# Patient Record
Sex: Female | Born: 1975 | Race: Black or African American | Hispanic: No | Marital: Single | Smoking: Former smoker
Health system: Southern US, Community
[De-identification: ages and names within clinical notes are randomized; demographics above are authoritative.]

## PROBLEM LIST (undated history)

## (undated) DIAGNOSIS — I10 Essential (primary) hypertension: Secondary | ICD-10-CM

## (undated) DIAGNOSIS — T7840XA Allergy, unspecified, initial encounter: Secondary | ICD-10-CM

## (undated) DIAGNOSIS — M1990 Unspecified osteoarthritis, unspecified site: Secondary | ICD-10-CM

## (undated) DIAGNOSIS — F329 Major depressive disorder, single episode, unspecified: Secondary | ICD-10-CM

## (undated) DIAGNOSIS — E079 Disorder of thyroid, unspecified: Secondary | ICD-10-CM

## (undated) DIAGNOSIS — F32A Depression, unspecified: Secondary | ICD-10-CM

## (undated) HISTORY — DX: Disorder of thyroid, unspecified: E07.9

## (undated) HISTORY — PX: TONSILLECTOMY: SUR1361

## (undated) HISTORY — PX: APPENDECTOMY: SHX54

## (undated) HISTORY — DX: Essential (primary) hypertension: I10

## (undated) HISTORY — DX: Allergy, unspecified, initial encounter: T78.40XA

## (undated) HISTORY — DX: Major depressive disorder, single episode, unspecified: F32.9

## (undated) HISTORY — DX: Unspecified osteoarthritis, unspecified site: M19.90

## (undated) HISTORY — PX: ABDOMINAL HYSTERECTOMY: SHX81

## (undated) HISTORY — DX: Depression, unspecified: F32.A

---

## 2016-01-26 LAB — BASIC METABOLIC PANEL
BUN: 12 (ref 4–21)
CREATININE: 0.8 (ref 0.5–1.1)
Glucose: 90
POTASSIUM: 3.8 (ref 3.4–5.3)
Sodium: 141 (ref 137–147)

## 2016-07-18 LAB — CBC AND DIFFERENTIAL
HEMATOCRIT: 39 (ref 36–46)
HEMOGLOBIN: 13.2 (ref 12.0–16.0)
PLATELETS: 269 (ref 150–399)
WBC: 12.3

## 2016-07-18 LAB — BASIC METABOLIC PANEL
BUN: 10 (ref 4–21)
Creatinine: 0.7 (ref 0.5–1.1)
Glucose: 112
Potassium: 3.3 — AB (ref 3.4–5.3)
SODIUM: 140 (ref 137–147)

## 2016-07-18 LAB — HEPATIC FUNCTION PANEL
ALT: 16 (ref 7–35)
AST: 21 (ref 13–35)

## 2016-07-18 LAB — LIPID PANEL
Cholesterol: 193 (ref 0–200)
HDL: 51 (ref 35–70)
LDL CALC: 122
TRIGLYCERIDES: 101 (ref 40–160)

## 2016-08-30 LAB — TSH: TSH: 5.82 (ref <=5.90)

## 2016-11-02 LAB — HEMOGLOBIN A1C: Hemoglobin A1C: 6.2

## 2017-12-02 ENCOUNTER — Encounter: Payer: Self-pay | Admitting: Family Medicine

## 2017-12-02 ENCOUNTER — Ambulatory Visit: Payer: BLUE CROSS/BLUE SHIELD | Admitting: Family Medicine

## 2017-12-02 VITALS — BP 128/80 | Ht 65.0 in | Wt 234.2 lb

## 2017-12-02 DIAGNOSIS — M5412 Radiculopathy, cervical region: Secondary | ICD-10-CM

## 2017-12-02 DIAGNOSIS — Z23 Encounter for immunization: Secondary | ICD-10-CM | POA: Diagnosis not present

## 2017-12-02 DIAGNOSIS — I10 Essential (primary) hypertension: Secondary | ICD-10-CM

## 2017-12-02 DIAGNOSIS — Z0000 Encounter for general adult medical examination without abnormal findings: Secondary | ICD-10-CM

## 2017-12-02 DIAGNOSIS — E039 Hypothyroidism, unspecified: Secondary | ICD-10-CM | POA: Diagnosis not present

## 2017-12-02 DIAGNOSIS — R7989 Other specified abnormal findings of blood chemistry: Secondary | ICD-10-CM | POA: Diagnosis not present

## 2017-12-02 DIAGNOSIS — M7712 Lateral epicondylitis, left elbow: Secondary | ICD-10-CM | POA: Insufficient documentation

## 2017-12-02 DIAGNOSIS — R739 Hyperglycemia, unspecified: Secondary | ICD-10-CM

## 2017-12-02 NOTE — Progress Notes (Signed)
im 

## 2017-12-02 NOTE — Progress Notes (Addendum)
Subjective:  Patient ID: Brenda Kim, female    DOB: 1976/07/14  Age: 42 y.o. MRN: [id number]  CC: Establish Care   HPI Phillis Thackeray presents for establishment of care and follow-up of her hypertension, elevated blood sugar, and hypothyroidism.  She also reports ongoing history of neck pain with a history of arthritis in her neck this is been accompanied by a stocking glove paresthesias in her left arm.  She also has pain in the elbow area.  She she has a history of repetitive motion in her job some years ago when she works at [employer].  She is currently working in mental health counseling and substance abuse.  She smokes 1 cigarette a day and is working on quitting.  She drinks occasionally.  She uses no illicit drugs.  She is active with walking, exercising and doing some yoga.  She also has a history of hypothyroidism and says that her levothyroxine was recently increased to 88 mcg 2 months ago.  She is nonfasting today and will return fasting for her blood work.  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  She is status post appendectomy.  She lives with her significant other and he also smokes.  She is nonfasting today and will return fasting for blood work.  Outpatient Medications Prior to Visit  Medication Sig Dispense Refill  . hydrochlorothiazide (HYDRODIURIL) 25 MG tablet Take 1 tablet by mouth daily.  0  . losartan (COZAAR) 100 MG tablet Take 1 tablet by mouth daily.  1  . metFORMIN (GLUCOPHAGE-XR) 500 MG 24 hr tablet Take 1 tablet by mouth daily.  0  . amLODipine (NORVASC) 10 MG tablet Take 1 tablet by mouth daily.    Marland Kitchen levothyroxine (SYNTHROID, LEVOTHROID) 88 MCG tablet Take 88 mcg by mouth daily before breakfast.     No facility-administered medications prior to visit.     ROS Review of Systems  Constitutional: Negative for chills, fatigue, fever and unexpected weight change.  HENT: Negative.   Eyes: Negative for photophobia and visual disturbance.   Respiratory: Negative for cough, chest tightness and shortness of breath.   Cardiovascular: Negative for chest pain and palpitations.  Gastrointestinal: Negative.   Endocrine: Negative for polyphagia and polyuria.  Genitourinary: Negative for difficulty urinating, frequency and hematuria.  Musculoskeletal: Positive for neck pain. Negative for neck stiffness.  Skin: Negative for pallor and rash.  Allergic/Immunologic: Negative for immunocompromised state.  Neurological: Positive for numbness. Negative for weakness and headaches.  Hematological: Does not bruise/bleed easily.  Psychiatric/Behavioral: Negative for behavioral problems, confusion and dysphoric mood.    Objective:  BP 128/80 (BP Location: Left Arm, Patient Position: Sitting, Cuff Size: Large)   Ht 5\' 5"  (1.651 m)   Wt 234 lb 4 oz (106.3 kg)   BMI 38.98 kg/m   BP Readings from Last 3 Encounters:  12/02/17 128/80    Wt Readings from Last 3 Encounters:  12/02/17 234 lb 4 oz (106.3 kg)    Physical Exam  Constitutional: She is oriented to person, place, and time. She appears well-developed and well-nourished. No distress.  HENT:  Head: Normocephalic and atraumatic.  Right Ear: External ear normal.  Left Ear: External ear normal.  Mouth/Throat: Oropharynx is clear and moist. No oropharyngeal exudate.  Eyes: Conjunctivae are normal. Pupils are equal, round, and reactive to light. Right eye exhibits no discharge. Left eye exhibits no discharge. No scleral icterus.  Neck: Neck supple. No JVD present. No tracheal deviation present. No thyromegaly  present.  Cardiovascular: Normal rate, regular rhythm and normal heart sounds.  Pulmonary/Chest: Effort normal and breath sounds normal. No stridor.  Abdominal: Bowel sounds are normal.  Musculoskeletal:       Left elbow: She exhibits normal range of motion. Tenderness found. Lateral epicondyle tenderness noted.       Cervical back: She exhibits bony tenderness. She exhibits  normal range of motion, no tenderness, no swelling and no spasm.       Back:       Arms: Lymphadenopathy:    She has no cervical adenopathy.  Neurological: She is alert and oriented to person, place, and time. She displays no atrophy and no tremor. She exhibits normal muscle tone.  Reflex Scores:      Tricep reflexes are 1+ on the right side and 1+ on the left side.      Bicep reflexes are 1+ on the right side and 1+ on the left side.      Brachioradialis reflexes are 1+ on the right side and 1+ on the left side. Positive spurling to left.   Skin: Skin is warm and dry. She is not diaphoretic. No erythema.  Psychiatric: She has a normal mood and affect. Her behavior is normal.    Lab Results  Component Value Date   WBC 4.2 12/06/2017   HGB 12.5 12/06/2017   HCT 38.3 12/06/2017   PLT 261.0 12/06/2017   GLUCOSE 116 (H) 12/06/2017   CHOL 183 12/06/2017   TRIG 90.0 12/06/2017   HDL 49.20 12/06/2017   LDLCALC 116 (H) 12/06/2017   ALT 11 12/06/2017   AST 15 12/06/2017   NA 142 12/06/2017   K 4.1 12/06/2017   CL 102 12/06/2017   CREATININE 0.90 12/06/2017   BUN 15 12/06/2017   CO2 34 (H) 12/06/2017   TSH 10.89 (H) 12/06/2017   HGBA1C 6.2 12/06/2017    Patient was never admitted.  Assessment & Plan:   Melina was seen today for establish care.  Diagnoses and all orders for this visit:  Need for influenza vaccination -     Flu Vaccine QUAD 36+ mos IM  Essential hypertension -     CBC; Future -     Comprehensive metabolic panel; Future -     Urinalysis, Routine w reflex microscopic; Future  Acquired hypothyroidism -     TSH; Future -     levothyroxine (SYNTHROID, LEVOTHROID) 100 MCG tablet; Take 1 tablet (100 mcg total) by mouth daily.  Elevated blood sugar -     Comprehensive metabolic panel; Future -     Hemoglobin A1c; Future -     Urinalysis, Routine w reflex microscopic; Future  Cervical radiculopathy -     Ambulatory referral to Sports Medicine  Lateral  epicondylitis of left elbow -     Ambulatory referral to [place] maintenance -     CBC; Future -     Comprehensive metabolic panel; Future -     Lipid panel; Future -     Urinalysis, Routine w reflex microscopic; Future -     HIV antibody; Future  Elevated TSH -     levothyroxine (SYNTHROID, LEVOTHROID) 100 MCG tablet; Take 1 tablet (100 mcg total) by mouth daily.   I have discontinued Luciann Lazarz's amLODipine and levothyroxine. I am also having her start on levothyroxine. Additionally, I am having her maintain her hydrochlorothiazide, losartan, and metFORMIN.  Meds ordered this encounter  Medications  . levothyroxine (SYNTHROID, LEVOTHROID) 100 MCG tablet  Sig: Take 1 tablet (100 mcg total) by mouth daily.    Dispense:  90 tablet    Refill:  0     Follow-up: Return in about 3 months (around 03/02/2018).  Libby Maw, MD

## 2017-12-05 ENCOUNTER — Other Ambulatory Visit: Payer: BLUE CROSS/BLUE SHIELD

## 2017-12-06 ENCOUNTER — Other Ambulatory Visit (INDEPENDENT_AMBULATORY_CARE_PROVIDER_SITE_OTHER): Payer: BLUE CROSS/BLUE SHIELD

## 2017-12-06 DIAGNOSIS — Z0000 Encounter for general adult medical examination without abnormal findings: Secondary | ICD-10-CM | POA: Diagnosis not present

## 2017-12-06 DIAGNOSIS — I10 Essential (primary) hypertension: Secondary | ICD-10-CM | POA: Diagnosis not present

## 2017-12-06 DIAGNOSIS — E039 Hypothyroidism, unspecified: Secondary | ICD-10-CM | POA: Diagnosis not present

## 2017-12-06 DIAGNOSIS — R739 Hyperglycemia, unspecified: Secondary | ICD-10-CM | POA: Diagnosis not present

## 2017-12-06 LAB — URINALYSIS, ROUTINE W REFLEX MICROSCOPIC
BILIRUBIN URINE: NEGATIVE
HGB URINE DIPSTICK: NEGATIVE
Leukocytes, UA: NEGATIVE
Nitrite: NEGATIVE
PH: 5.5 (ref 5.0–8.0)
Specific Gravity, Urine: 1.025 (ref 1.000–1.030)
UROBILINOGEN UA: 1 (ref 0.0–1.0)
Urine Glucose: NEGATIVE

## 2017-12-06 LAB — COMPREHENSIVE METABOLIC PANEL
ALK PHOS: 58 U/L (ref 39–117)
ALT: 11 U/L (ref 0–35)
AST: 15 U/L (ref 0–37)
Albumin: 4.2 g/dL (ref 3.5–5.2)
BILIRUBIN TOTAL: 0.5 mg/dL (ref 0.2–1.2)
BUN: 15 mg/dL (ref 6–23)
CO2: 34 mEq/L — ABNORMAL HIGH (ref 19–32)
CREATININE: 0.9 mg/dL (ref 0.40–1.20)
Calcium: 9.7 mg/dL (ref 8.4–10.5)
Chloride: 102 mEq/L (ref 96–112)
GFR: 88.02 mL/min (ref >60.00)
GLUCOSE: 116 mg/dL — AB (ref 70–99)
Potassium: 4.1 mEq/L (ref 3.5–5.1)
SODIUM: 142 meq/L (ref 135–145)
TOTAL PROTEIN: 6.9 g/dL (ref 6.0–8.3)

## 2017-12-06 LAB — LIPID PANEL
CHOLESTEROL: 183 mg/dL (ref 0–200)
HDL: 49.2 mg/dL (ref >39.00)
LDL CALC: 116 mg/dL — AB (ref 0–99)
NonHDL: 134.14
TRIGLYCERIDES: 90 mg/dL (ref 0.0–149.0)
Total CHOL/HDL Ratio: 4
VLDL: 18 mg/dL (ref 0.0–40.0)

## 2017-12-06 LAB — CBC
HCT: 38.3 % (ref 36.0–46.0)
HEMOGLOBIN: 12.5 g/dL (ref 12.0–15.0)
MCHC: 32.6 g/dL (ref 30.0–36.0)
MCV: 84.7 fl (ref 78.0–100.0)
Platelets: 261 10*3/uL (ref 150.0–400.0)
RBC: 4.52 Mil/uL (ref 3.87–5.11)
RDW: 15.5 % (ref 11.5–15.5)
WBC: 4.2 10*3/uL (ref 4.0–10.5)

## 2017-12-06 LAB — HEMOGLOBIN A1C: Hgb A1c MFr Bld: 6.2 % (ref 4.6–6.5)

## 2017-12-06 LAB — TSH: TSH: 10.89 u[IU]/mL — AB (ref 0.35–4.50)

## 2017-12-06 NOTE — Addendum Note (Signed)
Addended by: Jon Billings on: 12/06/2017 01:46 PM   Modules accepted: Orders

## 2017-12-07 LAB — HIV ANTIBODY (ROUTINE TESTING W REFLEX): HIV 1&2 Ab, 4th Generation: NONREACTIVE

## 2017-12-09 MED ORDER — LEVOTHYROXINE SODIUM 100 MCG PO TABS: 100.0000 ug | ORAL_TABLET | Freq: Every day | ORAL | 0 refills | Status: DC

## 2017-12-09 NOTE — Addendum Note (Signed)
Addended by: Jon Billings on: 12/09/2017 04:48 PM   Modules accepted: Orders

## 2017-12-10 ENCOUNTER — Other Ambulatory Visit: Payer: Self-pay

## 2017-12-10 DIAGNOSIS — R7989 Other specified abnormal findings of blood chemistry: Secondary | ICD-10-CM

## 2017-12-16 ENCOUNTER — Ambulatory Visit: Payer: BLUE CROSS/BLUE SHIELD | Admitting: Family Medicine

## 2017-12-16 ENCOUNTER — Encounter: Payer: Self-pay | Admitting: Family Medicine

## 2017-12-16 ENCOUNTER — Other Ambulatory Visit: Payer: Self-pay | Admitting: Family Medicine

## 2017-12-16 ENCOUNTER — Ambulatory Visit: Payer: BLUE CROSS/BLUE SHIELD | Admitting: Nurse Practitioner

## 2017-12-16 VITALS — BP 132/84 | HR 68 | Temp 98.6°F | Ht 65.0 in | Wt 228.0 lb

## 2017-12-16 DIAGNOSIS — G8929 Other chronic pain: Secondary | ICD-10-CM | POA: Diagnosis not present

## 2017-12-16 DIAGNOSIS — M5412 Radiculopathy, cervical region: Secondary | ICD-10-CM

## 2017-12-16 DIAGNOSIS — M5442 Lumbago with sciatica, left side: Secondary | ICD-10-CM | POA: Diagnosis not present

## 2017-12-16 NOTE — Assessment & Plan Note (Signed)
Seems to have a component of spasm at the neck and radiculopathy.  - counseled on HEP  - if no improvement can try prednisone or gabapentin - can f/u in 4-6 weeks if no improvement consider imaging and/or PT

## 2017-12-16 NOTE — Telephone Encounter (Signed)
Don't see mention in the chart about Lorazepam. No refill there.   Okay to refill the Baclofen.

## 2017-12-16 NOTE — Assessment & Plan Note (Signed)
Seems to be muscular in nature with radicular symptoms  - counseled on HEP  - If no improvement consider imaging and/or PT

## 2017-12-16 NOTE — Progress Notes (Signed)
Brenda Kim - 42 y.o. female MRN [id number]  Date of birth: [birth year]  SUBJECTIVE:  Including CC & ROS.  Chief Complaint  Patient presents with  . Neck Pain    Brenda Kim is a 42 y.o. female that is presenting with neck pain and lower back pain. Ongoing for two months.Pain is located in the lateral aspect of her neck. Admits to tingling and numbness. Pain radiates down the posterior aspect of her left arm. Denies injury or surgeries. She has been taking Motrin. Symptoms are mild to moderate in nature. Denies any inciting event. Has not started a new job or new exercises.   Complaining of left-sided lower back pain with some radicular type symptoms on the left lower aspect of her lower legs. Has not done anything to improve her symptoms. Feels like her symptoms are staying the same. Symptoms are worsened with sitting in certain positions. Has not found anything to improve her symptoms.   Review of Systems  Constitutional: Negative for fever.  HENT: Negative for rhinorrhea.   Respiratory: Negative for cough.   Cardiovascular: Negative for chest pain.  Gastrointestinal: Negative for abdominal pain.  Musculoskeletal: Positive for back pain.  Skin: Negative for color change.  Neurological: Negative for weakness.  Hematological: Negative for adenopathy.  Psychiatric/Behavioral: Negative for agitation.    HISTORY: Past Medical, Surgical, Social, and Family History Reviewed & Updated per EMR.   Pertinent Historical Findings include:  Past Medical History:  Diagnosis Date  . Allergy   . Arthritis   . Depression   . Hypertension   . Thyroid disease     Past Surgical History:  Procedure Laterality Date  . ABDOMINAL HYSTERECTOMY    . APPENDECTOMY    . TONSILLECTOMY      Allergies  Allergen Reactions  . Ofloxacin Itching    Hives, swelling, redness Hives, swelling, redness   . Flucloxacillin Hives  . Other Rash    Family History  Problem Relation Age of Onset  .  Arthritis Mother   . Hyperlipidemia Mother   . Arthritis Father   . Diabetes Father   . Hyperlipidemia Father   . Mental illness Brother   . Diabetes Son   . Hypertension Son      Social History   Socioeconomic History  . Marital status: Single    Spouse name: Not on file  . Number of children: Not on file  . Years of education: Not on file  . Highest education level: Not on file  Social Needs  . Financial resource strain: Not on file  . Food insecurity - worry: Not on file  . Food insecurity - inability: Not on file  . Transportation needs - medical: Not on file  . Transportation needs - non-medical: Not on file  Occupational History  . Not on file  Tobacco Use  . Smoking status: Current Every Day Smoker  . Smokeless tobacco: Never Used  Substance and Sexual Activity  . Alcohol use: Yes  . Drug use: Not on file  . Sexual activity: Not on file  Other Topics Concern  . Not on file  Social History Narrative  . Not on file     PHYSICAL EXAM:  VS: BP 132/84 (BP Location: Left Arm, Patient Position: Sitting, Cuff Size: Normal)   Pulse 68   Temp 98.6 F (37 C) (Oral)   Ht 5\' 5"  (1.651 m)   Wt 228 lb (103.4 kg)   SpO2 96%   BMI 37.94 kg/m  Physical Exam  Gen: NAD, alert, cooperative with exam, well-appearing ENT: normal lips, normal nasal mucosa,  Eye: normal EOM, normal conjunctiva and lids CV:  no edema, +2 pedal pulses   Resp: no accessory muscle use, non-labored,   Skin: no rashes, no areas of induration  Neuro: normal tone, normal sensation to touch Psych:  normal insight, alert and oriented MSK:  Neck:  Normal ROM  Normal strength to resistance with shrug  Normal shoulder ROM  Normal signs of atrophy in her hands  Normal finger adduction and abduction  Normal wrist flexion and extension strength to resistance.  Back:  Some TTP of the paraspinal of the left lower back  No TTP of midline lumbar spine and GT  Normal IR and ER of the hips  Normal knee  flexion and extension  Normal dorsal and plantar flexion  Negative SLR b/l  Normal gait  Neurovascularly intact      ASSESSMENT & PLAN:   Cervical radiculopathy Seems to have a component of spasm at the neck and radiculopathy.  - counseled on HEP  - if no improvement can try prednisone or gabapentin - can f/u in 4-6 weeks if no improvement consider imaging and/or PT    Chronic left-sided low back pain with left-sided sciatica Seems to be muscular in nature with radicular symptoms  - counseled on HEP  - If no improvement consider imaging and/or PT

## 2017-12-16 NOTE — Patient Instructions (Signed)
Please try the exercises  Please let me know if you want to try a medication  If your symptoms have not improved in 4-6 weeks then please follow up with me

## 2017-12-17 ENCOUNTER — Encounter: Payer: Self-pay | Admitting: Nurse Practitioner

## 2017-12-17 ENCOUNTER — Other Ambulatory Visit (HOSPITAL_COMMUNITY)
Admission: RE | Admit: 2017-12-17 | Discharge: 2017-12-17 | Disposition: A | Discharge status: Home / Self Care | Payer: BLUE CROSS/BLUE SHIELD | Source: Ambulatory Visit | Attending: Nurse Practitioner | Admitting: Nurse Practitioner

## 2017-12-17 ENCOUNTER — Ambulatory Visit: Payer: BLUE CROSS/BLUE SHIELD | Admitting: Nurse Practitioner

## 2017-12-17 VITALS — BP 120/82 | HR 65 | Temp 98.3°F | Ht 65.0 in | Wt 235.0 lb

## 2017-12-17 DIAGNOSIS — R300 Dysuria: Secondary | ICD-10-CM

## 2017-12-17 DIAGNOSIS — N898 Other specified noninflammatory disorders of vagina: Secondary | ICD-10-CM

## 2017-12-17 LAB — POCT URINALYSIS DIPSTICK
BILIRUBIN UA: NEGATIVE
Blood, UA: NEGATIVE
GLUCOSE UA: NEGATIVE
Ketones, UA: NEGATIVE
LEUKOCYTES UA: NEGATIVE
Nitrite, UA: NEGATIVE
Protein, UA: NEGATIVE
Spec Grav, UA: >=1.03 — AB (ref 1.010–1.025)
Urobilinogen, UA: 0.2 E.U./dL
pH, UA: 6 (ref 5.0–8.0)

## 2017-12-17 MED ORDER — METRONIDAZOLE 500 MG PO TABS: 500.0000 mg | ORAL_TABLET | Freq: Two times a day (BID) | ORAL | 0 refills | Status: DC

## 2017-12-17 NOTE — Patient Instructions (Addendum)
Urinalysis was negative for UTI. Due to symptoms urine sent for culture.  you will be called with wet prep results.

## 2017-12-17 NOTE — Progress Notes (Signed)
Subjective:  Patient ID: Brenda Kim, female    DOB: Jul 08, 1976  Age: 42 y.o. MRN: [id number]  CC: Back Pain (lower left back pain---2 days- took ibuprofen) and Vaginal Discharge (discharge,odor--hx of BV burning when urinate a little--1 mo)   Vaginal Discharge  The patient's primary symptoms include genital itching, a genital odor and vaginal discharge. The patient's pertinent negatives include no genital rash, pelvic pain or vaginal bleeding. This is a recurrent problem. The current episode started more than 1 month ago. The problem occurs constantly. The problem has been waxing and waning. The problem affects both sides. She is not pregnant. Associated symptoms include back pain and dysuria. Pertinent negatives include no abdominal pain, chills, constipation, diarrhea, discolored urine, fever, flank pain, frequency, headaches, hematuria, joint pain, joint swelling, nausea, painful intercourse, rash, sore throat, urgency or vomiting. The vaginal discharge was milky, thick, white and yellow. There has been no bleeding. The symptoms are aggravated by intercourse. She has tried nothing for the symptoms. She is sexually active. It is unknown whether or not her partner has an STD. She uses hysterectomy for contraception. Her past medical history is significant for an STD and vaginosis. There is no history of PID.  re[port hx of recurrent BV, last treated 08/2017 with metronidazole tabs. Metronidazole gel is ineffective per patient.  Outpatient Medications Prior to Visit  Medication Sig Dispense Refill  . baclofen (LIORESAL) 10 MG tablet TAKE 1 TABLET BY MOUTH TWICE DAILY AS NEEDED FOR MUSCLE SPASMS 30 tablet 0  . baclofen (LIORESAL) 10 MG tablet Take 10 mg by mouth 3 (three) times daily.    . Cholecalciferol (VITAMIN D PO) Take by mouth.    . hydrochlorothiazide (HYDRODIURIL) 25 MG tablet Take 1 tablet by mouth daily.  0  . levothyroxine (SYNTHROID, LEVOTHROID) 100 MCG tablet Take 1 tablet (100 mcg  total) by mouth daily. 90 tablet 0  . LORazepam (ATIVAN) 0.5 MG tablet Take 0.5 mg by mouth every 8 (eight) hours.    Marland Kitchen losartan (COZAAR) 100 MG tablet Take 1 tablet by mouth daily.  1  . metFORMIN (GLUCOPHAGE-XR) 500 MG 24 hr tablet Take 1 tablet by mouth daily.  0  . NON FORMULARY Cinnamon OTC     No facility-administered medications prior to visit.     ROS See HPI  Objective:  BP 120/82   Pulse 65   Temp 98.3 F (36.8 C)   Ht 5\' 5"  (1.651 m)   Wt 235 lb (106.6 kg)   SpO2 97%   BMI 39.11 kg/m   BP Readings from Last 3 Encounters:  12/17/17 120/82  12/16/17 132/84  12/02/17 128/80    Wt Readings from Last 3 Encounters:  12/17/17 235 lb (106.6 kg)  12/16/17 228 lb (103.4 kg)  12/02/17 234 lb 4 oz (106.3 kg)    Physical Exam  Genitourinary: There is no rash on the right labia. There is no rash on the left labia. Right adnexum displays no tenderness. Left adnexum displays no tenderness. No erythema or tenderness in the vagina. Vaginal discharge found.    Lab Results  Component Value Date   WBC 4.2 12/06/2017   HGB 12.5 12/06/2017   HCT 38.3 12/06/2017   PLT 261.0 12/06/2017   GLUCOSE 116 (H) 12/06/2017   CHOL 183 12/06/2017   TRIG 90.0 12/06/2017   HDL 49.20 12/06/2017   LDLCALC 116 (H) 12/06/2017   ALT 11 12/06/2017   AST 15 12/06/2017   NA 142 12/06/2017   K 4.1  12/06/2017   CL 102 12/06/2017   CREATININE 0.90 12/06/2017   BUN 15 12/06/2017   CO2 34 (H) 12/06/2017   TSH 10.89 (H) 12/06/2017   HGBA1C 6.2 12/06/2017    Assessment & Plan:   Spencer was seen today for back pain and vaginal discharge.  Diagnoses and all orders for this visit:  Dysuria -     POCT urinalysis dipstick -     Urine Culture  Vaginal discharge -     Cervicovaginal ancillary only -     metroNIDAZOLE (FLAGYL) 500 MG tablet; Take 1 tablet (500 mg total) by mouth 2 (two) times daily.   I am having Rikki Spearing start on metroNIDAZOLE. I am also having her maintain her  hydrochlorothiazide, losartan, metFORMIN, levothyroxine, baclofen, baclofen, NON FORMULARY, Cholecalciferol (VITAMIN D PO), and LORazepam.  Meds ordered this encounter  Medications  . metroNIDAZOLE (FLAGYL) 500 MG tablet    Sig: Take 1 tablet (500 mg total) by mouth 2 (two) times daily.    Dispense:  14 tablet    Refill:  0    Order Specific Question:   Supervising Provider    Answer:   Lucille Passy [[id number]]    Follow-up: Return if symptoms worsen or fail to improve.  Wilfred Lacy, NP

## 2017-12-18 ENCOUNTER — Other Ambulatory Visit: Payer: Self-pay | Admitting: Nurse Practitioner

## 2017-12-18 DIAGNOSIS — N898 Other specified noninflammatory disorders of vagina: Secondary | ICD-10-CM

## 2017-12-18 LAB — URINE CULTURE
MICRO NUMBER:: 90122736
SPECIMEN QUALITY: ADEQUATE

## 2017-12-18 LAB — CERVICOVAGINAL ANCILLARY ONLY
BACTERIAL VAGINITIS: POSITIVE — AB
CHLAMYDIA, DNA PROBE: NEGATIVE
Candida vaginitis: NEGATIVE
NEISSERIA GONORRHEA: NEGATIVE
Trichomonas: NEGATIVE

## 2018-01-07 ENCOUNTER — Encounter: Payer: Self-pay | Admitting: Family Medicine

## 2018-01-07 ENCOUNTER — Other Ambulatory Visit: Payer: Self-pay | Admitting: Family Medicine

## 2018-01-19 ENCOUNTER — Other Ambulatory Visit: Payer: Self-pay | Admitting: Family Medicine

## 2018-01-22 ENCOUNTER — Ambulatory Visit: Payer: BLUE CROSS/BLUE SHIELD | Admitting: Family Medicine

## 2018-01-22 ENCOUNTER — Encounter: Payer: Self-pay | Admitting: Family Medicine

## 2018-01-22 ENCOUNTER — Ambulatory Visit (INDEPENDENT_AMBULATORY_CARE_PROVIDER_SITE_OTHER): Payer: BLUE CROSS/BLUE SHIELD

## 2018-01-22 VITALS — BP 110/90 | HR 64 | Wt 230.8 lb

## 2018-01-22 DIAGNOSIS — S8002XA Contusion of left knee, initial encounter: Secondary | ICD-10-CM | POA: Insufficient documentation

## 2018-01-22 DIAGNOSIS — F419 Anxiety disorder, unspecified: Secondary | ICD-10-CM | POA: Diagnosis not present

## 2018-01-22 DIAGNOSIS — F329 Major depressive disorder, single episode, unspecified: Secondary | ICD-10-CM | POA: Diagnosis not present

## 2018-01-22 DIAGNOSIS — E119 Type 2 diabetes mellitus without complications: Secondary | ICD-10-CM

## 2018-01-22 DIAGNOSIS — F32A Depression, unspecified: Secondary | ICD-10-CM | POA: Insufficient documentation

## 2018-01-22 MED ORDER — LORAZEPAM 0.5 MG PO TABS: 0.5000 mg | ORAL_TABLET | Freq: Every day | ORAL | 0 refills | Status: DC | PRN

## 2018-01-22 MED ORDER — METFORMIN HCL ER 500 MG PO TB24: 500.0000 mg | ORAL_TABLET | Freq: Every day | ORAL | 1 refills | Status: DC

## 2018-01-22 MED ORDER — PAROXETINE HCL ER 12.5 MG PO TB24: ORAL_TABLET | ORAL | 1 refills | Status: AC

## 2018-01-22 NOTE — Progress Notes (Signed)
Subjective:  Patient ID: Brenda Kim, female    DOB: December 26, 1975  Age: 42 y.o. MRN: [id number]  CC: Acute Visit   HPI Brenda Kim presents for evaluation of knee injury she sustained 2 weeks ago.  She was skating and tripped over a young person and landed directly on her left knee.  She has ongoing tenderness over her kneecap and cannot crawl on her knees.  This is not penetrate may or one that locks up or gives way.  She also tells a long-standing history of anxiety and depression.  She requests refill of lorazepam says that she uses it often and it helps to relieve her symptoms.  She is currently not taking an antidepressant.  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  She is the mother or children to her grown and out of the house but at home with her.  Outpatient Medications Prior to Visit  Medication Sig Dispense Refill  . baclofen (LIORESAL) 10 MG tablet TAKE 1 TABLET BY MOUTH TWICE DAILY AS NEEDED FOR MUSCLE SPASMS 30 tablet 0  . Cholecalciferol (VITAMIN D PO) Take by mouth.    . hydrochlorothiazide (HYDRODIURIL) 25 MG tablet Take 1 tablet by mouth daily.  0  . levothyroxine (SYNTHROID, LEVOTHROID) 100 MCG tablet Take 1 tablet (100 mcg total) by mouth daily. 90 tablet 0  . losartan (COZAAR) 100 MG tablet TAKE 1 TABLET BY MOUTH ONCE DAILY 90 tablet 1  . metroNIDAZOLE (FLAGYL) 500 MG tablet Take 1 tablet (500 mg total) by mouth 2 (two) times daily. 14 tablet 0  . NON FORMULARY Cinnamon OTC    . baclofen (LIORESAL) 10 MG tablet Take 10 mg by mouth 3 (three) times daily.    Marland Kitchen LORazepam (ATIVAN) 0.5 MG tablet Take 0.5 mg by mouth every 8 (eight) hours.    . metFORMIN (GLUCOPHAGE-XR) 500 MG 24 hr tablet Take 1 tablet by mouth daily.  0   No facility-administered medications prior to visit.     ROS Review of Systems  Constitutional: Negative.   HENT: Negative.   Eyes: Negative.   Respiratory: Negative.   Cardiovascular: Negative.   Gastrointestinal: Negative.   Genitourinary:  Negative.   Musculoskeletal: Positive for arthralgias. Negative for gait problem.  Skin: Negative.   Psychiatric/Behavioral: Positive for dysphoric mood. Negative for self-injury and suicidal ideas. The patient is nervous/anxious.     Objective:  BP 110/90 (BP Location: Right Arm, Patient Position: Sitting, Cuff Size: Large)   Pulse 64   Wt 230 lb 12.8 oz (104.7 kg)   BMI 38.41 kg/m   BP Readings from Last 3 Encounters:  01/22/18 110/90  12/17/17 120/82  12/16/17 132/84    Wt Readings from Last 3 Encounters:  01/22/18 230 lb 12.8 oz (104.7 kg)  12/17/17 235 lb (106.6 kg)  12/16/17 228 lb (103.4 kg)    Physical Exam  Constitutional: She is oriented to person, place, and time. She appears well-developed and well-nourished. No distress.  HENT:  Head: Normocephalic and atraumatic.  Right Ear: External ear normal.  Left Ear: External ear normal.  Eyes: Right eye exhibits no discharge. Left eye exhibits no discharge. No scleral icterus.  Pulmonary/Chest: Effort normal.  Musculoskeletal:       Left knee: She exhibits normal range of motion and no effusion. Tenderness found. Medial joint line and patellar tendon tenderness noted.  Neurological: She is alert and oriented to person, place, and time.  Skin: Skin is warm and dry. She is not diaphoretic.  Psychiatric: She  has a normal mood and affect. Her behavior is normal.   Depression screen [facility] 2/9 01/22/2018  Decreased Interest 2  Down, Depressed, Hopeless 2  PHQ - 2 Score 4  Altered sleeping 2  Tired, decreased energy 2  Change in appetite 2  Feeling bad or failure about yourself  2  Trouble concentrating 2  Moving slowly or fidgety/restless 0  Suicidal thoughts 0  PHQ-9 Score 14    Lab Results  Component Value Date   WBC 4.2 12/06/2017   HGB 12.5 12/06/2017   HCT 38.3 12/06/2017   PLT 261.0 12/06/2017   GLUCOSE 116 (H) 12/06/2017   CHOL 183 12/06/2017   TRIG 90.0 12/06/2017   HDL 49.20 12/06/2017   LDLCALC 116  (H) 12/06/2017   ALT 11 12/06/2017   AST 15 12/06/2017   NA 142 12/06/2017   K 4.1 12/06/2017   CL 102 12/06/2017   CREATININE 0.90 12/06/2017   BUN 15 12/06/2017   CO2 34 (H) 12/06/2017   TSH 10.89 (H) 12/06/2017   HGBA1C 6.2 12/06/2017    No results found.  Assessment & Plan:   Brenda Kim was seen today for acute visit.  Diagnoses and all orders for this visit:  Contusion of left knee, initial encounter -     DG Knee Complete 4 Views Left; Future -     DG Knee Complete 4 Views Left  Anxiety and depression -     Ambulatory referral to Psychology -     PARoxetine (PAXIL-CR) 12.5 MG 24 hr tablet; Take one each morning for one week and then increase to two each day. -     LORazepam (ATIVAN) 0.5 MG tablet; Take 1 tablet (0.5 mg total) by mouth daily as needed for anxiety.  Controlled type 2 diabetes mellitus without complication, without long-term current use of insulin (HCC) -     metFORMIN (GLUCOPHAGE-XR) 500 MG 24 hr tablet; Take 1 tablet (500 mg total) by mouth at bedtime.   I have changed Brenda Kim's LORazepam and metFORMIN. I am also having her start on PARoxetine. Additionally, I am having her maintain her hydrochlorothiazide, levothyroxine, baclofen, NON FORMULARY, Cholecalciferol (VITAMIN D PO), metroNIDAZOLE, and losartan.  Meds ordered this encounter  Medications  . PARoxetine (PAXIL-CR) 12.5 MG 24 hr tablet    Sig: Take one each morning for one week and then increase to two each day.    Dispense:  60 tablet    Refill:  1  . LORazepam (ATIVAN) 0.5 MG tablet    Sig: Take 1 tablet (0.5 mg total) by mouth daily as needed for anxiety.    Dispense:  30 tablet    Refill:  0  . metFORMIN (GLUCOPHAGE-XR) 500 MG 24 hr tablet    Sig: Take 1 tablet (500 mg total) by mouth at bedtime.    Dispense:  100 tablet    Refill:  1     Follow-up: Return in about 1 month (around 02/22/2018).  Libby Maw, MD

## 2018-01-22 NOTE — Patient Instructions (Addendum)
Living With Depression Everyone experiences occasional disappointment, sadness, and loss in their lives. When you are feeling down, blue, or sad for at least 2 weeks in a row, it may mean that you have depression. Depression can affect your thoughts and feelings, relationships, daily activities, and physical health. It is caused by changes in the way your brain functions. If you receive a diagnosis of depression, your health care provider will tell you which type of depression you have and what treatment options are available to you. If you are living with depression, there are ways to help you recover from it and also ways to prevent it from coming back. How to cope with lifestyle changes Coping with stress Stress is your body's reaction to life changes and events, both good and bad. Stressful situations may include:  Getting married.  The death of a spouse.  Losing a job.  Retiring.  Having a baby.  Stress can last just a few hours or it can be ongoing. Stress can play a major role in depression, so it is important to learn both how to cope with stress and how to think about it differently. Talk with your health care provider or a counselor if you would like to learn more about stress reduction. He or she may suggest some stress reduction techniques, such as:  Music therapy. This can include creating music or listening to music. Choose music that you enjoy and that inspires you.  Mindfulness-based meditation. This kind of meditation can be done while sitting or walking. It involves being aware of your normal breaths, rather than trying to control your breathing.  Centering prayer. This is a kind of meditation that involves focusing on a spiritual word or phrase. Choose a word, phrase, or sacred image that is meaningful to you and that brings you peace.  Deep breathing. To do this, expand your stomach and inhale slowly through your nose. Hold your breath for 3-5 seconds, then exhale  slowly, allowing your stomach muscles to relax.  Muscle relaxation. This involves intentionally tensing muscles then relaxing them.  Choose a stress reduction technique that fits your lifestyle and personality. Stress reduction techniques take time and practice to develop. Set aside 5-15 minutes a day to do them. Therapists can offer training in these techniques. The training may be covered by some insurance plans. Other things you can do to manage stress include:  Keeping a stress diary. This can help you learn what triggers your stress and ways to control your response.  Understanding what your limits are and saying no to requests or events that lead to a schedule that is too full.  Thinking about how you respond to certain situations. You may not be able to control everything, but you can control how you react.  Adding humor to your life by watching funny films or TV shows.  Making time for activities that help you relax and not feeling guilty about spending your time this way.  Medicines Your health care provider may suggest certain medicines if he or she feels that they will help improve your condition. Avoid using alcohol and other substances that may prevent your medicines from working properly (may interact). It is also important to:  Talk with your pharmacist or health care provider about all the medicines that you take, their possible side effects, and what medicines are safe to take together.  Make it your goal to take part in all treatment decisions (shared decision-making). This includes giving input on the side   effects of medicines. It is best if shared decision-making with your health care provider is part of your total treatment plan.  If your health care provider prescribes a medicine, you may not notice the full benefits of it for 4-8 weeks. Most people who are treated for depression need to be on medicine for at least 6-12 months after they feel better. If you are taking  medicines as part of your treatment, do not stop taking medicines without first talking to your health care provider. You may need to have the medicine slowly decreased (tapered) over time to decrease the risk of harmful side effects. Relationships Your health care provider may suggest family therapy along with individual therapy and drug therapy. While there may not be family problems that are causing you to feel depressed, it is still important to make sure your family learns as much as they can about your mental health. Having your family's support can help make your treatment successful. How to recognize changes in your condition Everyone has a different response to treatment for depression. Recovery from major depression happens when you have not had signs of major depression for two months. This may mean that you will start to:  Have more interest in doing activities.  Feel less hopeless than you did 2 months ago.  Have more energy.  Overeat less often, or have better or improving appetite.  Have better concentration.  Your health care provider will work with you to decide the next steps in your recovery. It is also important to recognize when your condition is getting worse. Watch for these signs:  Having fatigue or low energy.  Eating too much or too little.  Sleeping too much or too little.  Feeling restless, agitated, or hopeless.  Having trouble concentrating or making decisions.  Having unexplained physical complaints.  Feeling irritable, angry, or aggressive.  Get help as soon as you or your family members notice these symptoms coming back. How to get support and help from others How to talk with friends and family members about your condition Talking to friends and family members about your condition can provide you with one way to get support and guidance. Reach out to trusted friends or family members, explain your symptoms to them, and let them know that you are  working with a health care provider to treat your depression. Financial resources Not all insurance plans cover mental health care, so it is important to check with your insurance carrier. If paying for co-pays or counseling services is a problem, search for a local or county mental health care center. They may be able to offer public mental health care services at low or no cost when you are not able to see a private health care provider. If you are taking medicine for depression, you may be able to get the generic form, which may be less expensive. Some makers of prescription medicines also offer help to patients who cannot afford the medicines they need. Follow these instructions at home:  Get the right amount and quality of sleep.  Cut down on using caffeine, tobacco, alcohol, and other potentially harmful substances.  Try to exercise, such as walking or lifting small weights.  Take over-the-counter and prescription medicines only as told by your health care provider.  Eat a healthy diet that includes plenty of vegetables, fruits, whole grains, low-fat dairy products, and lean protein. Do not eat a lot of foods that are high in solid fats, added sugars, or salt.    Keep all follow-up visits as told by your health care provider. This is important. Contact a health care provider if:  You stop taking your antidepressant medicines, and you have any of these symptoms: ? Nausea. ? Headache. ? Feeling lightheaded. ? Chills and body aches. ? Not being able to sleep (insomnia).  You or your friends and family think your depression is getting worse. Get help right away if:  You have thoughts of hurting yourself or others. If you ever feel like you may hurt yourself or others, or have thoughts about taking your own life, get help right away. You can go to your nearest emergency department or call:  Your local emergency services (911 in the [place]  A suicide crisis helpline, such as the  [place] at 3321879589. This is open 24-hours a day.  Summary  If you are living with depression, there are ways to help you recover from it and also ways to prevent it from coming back.  Work with your health care team to create a management plan that includes counseling, stress management techniques, and healthy lifestyle habits. This information is not intended to replace advice given to you by your health care provider. Make sure you discuss any questions you have with your health care provider. Document Released: 10/08/2016 Document Revised: 10/08/2016 Document Reviewed: 10/08/2016 Elsevier Interactive Patient Education  2018 [employer].  Type 2 Diabetes Mellitus, Diagnosis, Adult Type 2 diabetes (type 2 diabetes mellitus) is a long-term (chronic) disease. In type 2 diabetes, one or both of these problems may be present:  The pancreas does not make enough of a hormone called insulin.  Cells in the body do not respond properly to insulin that the body makes (insulin resistance).  Normally, insulin allows blood sugar (glucose) to enter cells in the body. The cells use glucose for energy. Insulin resistance or lack of insulin causes excess glucose to build up in the blood instead of going into cells. As a result, high blood glucose (hyperglycemia) develops. What increases the risk? The following factors may make you more likely to develop type 2 diabetes:  Having a family member with type 2 diabetes.  Being overweight or obese.  Having an inactive (sedentary) lifestyle.  Having been diagnosed with insulin resistance.  Having a history of prediabetes, gestational diabetes, or polycystic ovarian syndrome (PCOS).  Being of American-Indian, African-American, Hispanic/Latino, or Asian/Pacific Islander descent.  What are the signs or symptoms? In the early stage of this condition, you may not have symptoms. Symptoms develop slowly and may  include:  Increased thirst (polydipsia).  Increased hunger(polyphagia).  Increased urination (polyuria).  Increased urination during the night (nocturia).  Unexplained weight loss.  Frequent infections that keep coming back (recurring).  Fatigue.  Weakness.  Vision changes, such as blurry vision.  Cuts or bruises that are slow to heal.  Tingling or numbness in the hands or feet.  Dark patches on the skin (acanthosis nigricans).  How is this diagnosed?  This condition is diagnosed based on your symptoms, your medical history, a physical exam, and your blood glucose level. Your blood glucose may be checked with one or more of the following blood tests:  A fasting blood glucose (FBG) test. You will not be allowed to eat (you will fast) for at least 8 hours before a blood sample is taken.  A random blood glucose test. This checks blood glucose at any time of day regardless of when you ate.  An A1c (hemoglobin A1c) blood  test. This provides information about blood glucose control over the previous 2-3 months.  An oral glucose tolerance test (OGTT). This measures your blood glucose at two times: ? After fasting. This is your baseline blood glucose level. ? Two hours after drinking a beverage that contains glucose.  You may be diagnosed with type 2 diabetes if:  Your FBG level is 126 mg/dL (7.0 mmol/L) or higher.  Your random blood glucose level is 200 mg/dL (11.1 mmol/L) or higher.  Your A1c level is 6.5% or higher.  Your OGGT result is higher than 200 mg/dL (11.1 mmol/L).  These blood tests may be repeated to confirm your diagnosis. How is this treated?  Your treatment may be managed by a specialist called an endocrinologist. Type 2 diabetes may be treated by following instructions from your health care provider about:  Making diet and lifestyle changes. This may include: ? Following an individualized nutrition plan that is developed by a diet and nutrition  specialist (registered dietitian). ? Exercising regularly. ? Finding ways to manage stress.  Checking your blood glucose level as often as directed.  Taking diabetes medicines or insulin daily. This helps to keep your blood glucose levels in the healthy range. ? If you use insulin, you may need to adjust the dosage depending on how physically active you are and what foods you eat. Your health care provider will tell you how to adjust your dosage.  Taking medicines to help prevent complications from diabetes, such as: ? Aspirin. ? Medicine to lower cholesterol. ? Medicine to control blood pressure.  Your health care provider will set individualized treatment goals for you. Your goals will be based on your age, other medical conditions you have, and how you respond to diabetes treatment. Generally, the goal of treatment is to maintain the following blood glucose levels:  Before meals (preprandial): 80-130 mg/dL (4.4-7.2 mmol/L).  After meals (postprandial): below 180 mg/dL (10 mmol/L).  A1c level: less than 7%.  Follow these instructions at home: Questions to [place] Provider Consider asking the following questions:  Do I need to meet with a diabetes educator?  Where can I find a support group for people with diabetes?  What equipment will I need to manage my diabetes at home?  What diabetes medicines do I need, and when should I take them?  How often do I need to check my blood glucose?  What number can I call if I have questions?  When is my next appointment?  General instructions  Take over-the-counter and prescription medicines only as told by your health care provider.  Keep all follow-up visits as told by your health care provider. This is important.  For more information about diabetes, visit: ? American Diabetes Association (ADA): www.diabetes.org ? American Association of Diabetes Educators (AADE): www.diabeteseducator.org/patient-resources Contact  a health care provider if:  Your blood glucose is at or above 240 mg/dL (13.3 mmol/L) for 2 days in a row.  You have been sick or have had a fever for 2 days or longer and you are not getting better.  You have any of the following problems for more than 6 hours: ? You cannot eat or drink. ? You have nausea and vomiting. ? You have diarrhea. Get help right away if:  Your blood glucose is lower than 54 mg/dL (3.0 mmol/L).  You become confused or you have trouble thinking clearly.  You have difficulty breathing.  You have moderate or large ketone levels in your urine. This information  is not intended to replace advice given to you by your health care provider. Make sure you discuss any questions you have with your health care provider. Document Released: 11/05/2005 Document Revised: 04/12/2016 Document Reviewed: 12/09/2015 Elsevier Interactive Patient Education  [employer].

## 2018-02-21 ENCOUNTER — Other Ambulatory Visit: Payer: BLUE CROSS/BLUE SHIELD

## 2018-02-24 ENCOUNTER — Other Ambulatory Visit: Payer: Self-pay | Admitting: Family Medicine

## 2018-02-24 ENCOUNTER — Other Ambulatory Visit (INDEPENDENT_AMBULATORY_CARE_PROVIDER_SITE_OTHER): Payer: BLUE CROSS/BLUE SHIELD

## 2018-02-24 ENCOUNTER — Telehealth: Payer: Self-pay | Admitting: Family Medicine

## 2018-02-24 DIAGNOSIS — R7989 Other specified abnormal findings of blood chemistry: Secondary | ICD-10-CM | POA: Diagnosis not present

## 2018-02-24 DIAGNOSIS — E039 Hypothyroidism, unspecified: Secondary | ICD-10-CM

## 2018-02-24 LAB — TSH: TSH: 1.18 u[IU]/mL (ref 0.35–4.50)

## 2018-02-24 NOTE — Telephone Encounter (Signed)
Do you want to hold off on refilling the Levothyroxine until her lab result comes back?

## 2018-02-24 NOTE — Telephone Encounter (Signed)
Patient came into office to do labs. Patient is requesting a prescription refill on baclofen and levothyroxine. Please notify patient about the status of refill.

## 2018-02-25 MED ORDER — LEVOTHYROXINE SODIUM 100 MCG PO TABS: 100.0000 ug | ORAL_TABLET | Freq: Every day | ORAL | 1 refills | Status: DC

## 2018-02-25 NOTE — Telephone Encounter (Signed)
Rx's sent, patient aware & patient is aware that her thyroid level is normal.

## 2018-02-26 ENCOUNTER — Ambulatory Visit: Payer: BLUE CROSS/BLUE SHIELD | Admitting: Psychology

## 2018-03-10 ENCOUNTER — Ambulatory Visit (INDEPENDENT_AMBULATORY_CARE_PROVIDER_SITE_OTHER): Payer: BLUE CROSS/BLUE SHIELD | Admitting: Psychology

## 2018-03-10 DIAGNOSIS — F4323 Adjustment disorder with mixed anxiety and depressed mood: Secondary | ICD-10-CM

## 2018-03-11 ENCOUNTER — Ambulatory Visit: Payer: BLUE CROSS/BLUE SHIELD | Admitting: Family Medicine

## 2018-03-11 ENCOUNTER — Encounter: Payer: Self-pay | Admitting: Family Medicine

## 2018-03-11 DIAGNOSIS — K589 Irritable bowel syndrome without diarrhea: Secondary | ICD-10-CM | POA: Insufficient documentation

## 2018-03-11 DIAGNOSIS — K581 Irritable bowel syndrome with constipation: Secondary | ICD-10-CM

## 2018-03-11 NOTE — Progress Notes (Signed)
   Subjective:    Patient ID: Brenda Kim, female    DOB: [birth year], 42 y.o.   MRN: [id number]  Chief Complaint  Patient presents with  . Abdominal Pain     HPI Brenda Kim is a 42 y.o. female here today with complaint of abdominal discomfort and bloating.  She has a history of IBS with constipation and is followed by GI for management of this.  She tells me today that she has had what she describes as as "knot" in the left abdominal area that comes and goes.  She had her GI specialist look at this however was told there was nothing there.  She continues to notice this and notes that when she pushes on this or lays on this she will belch or have gas and the area tends to improve.  She denies significant pain associated with this area.  She does continue to have some issues with constipation and was recently prescribed linzess which was helpful.  She continues miralax for maintenance.   She denies nausea, vomiting, anorexia, blood in stool, diarrhea, fever, or chills.  TSH was recently checked and was normal.    Review of Systems ROS per HPI, otherwise negative.     Objective:   Physical Exam  Constitutional: She is oriented to person, place, and time. She appears well-nourished. No distress.  HENT:  Mouth/Throat: Oropharynx is clear and moist.  Eyes: No scleral icterus.  Cardiovascular: Normal rate, regular rhythm and normal heart sounds.  Pulmonary/Chest: Effort normal and breath sounds normal.  Abdominal: Soft. She exhibits no distension and no mass. There is no tenderness.  Bowel sounds hypoactive but present  Neurological: She is alert and oriented to person, place, and time.  Psychiatric: She has a normal mood and affect. Her behavior is normal.          Assessment & Plan:  IBS (irritable bowel syndrome) IBS, constipation predominant.  I suspect the intermittent fullness that she is experiencing may be bowel distention from stool or gas.  Reassured her that no masses are  palpated on exam.  She will continue bowel regimen and following with GI specialist.  Discussed FODMAP diet and given handout that may help with gas and bloating as well.

## 2018-03-11 NOTE — Assessment & Plan Note (Signed)
IBS, constipation predominant.  I suspect the intermittent fullness that she is experiencing may be bowel distention from stool or gas.  Reassured her that no masses are palpated on exam.  She will continue bowel regimen and following with GI specialist.  Discussed FODMAP diet and given handout that may help with gas and bloating as well.

## 2018-03-11 NOTE — Patient Instructions (Signed)
Continue bowel regimen for constipation with linzess as needed Try FODMAP diet

## 2018-03-31 ENCOUNTER — Ambulatory Visit: Payer: Self-pay | Admitting: Psychology

## 2018-04-01 ENCOUNTER — Ambulatory Visit: Payer: Self-pay | Admitting: Nurse Practitioner

## 2018-04-01 ENCOUNTER — Encounter: Payer: Self-pay | Admitting: Nurse Practitioner

## 2018-04-01 VITALS — BP 110/74 | HR 68 | Temp 98.4°F | Ht 65.0 in | Wt 220.0 lb

## 2018-04-01 DIAGNOSIS — N761 Subacute and chronic vaginitis: Secondary | ICD-10-CM

## 2018-04-01 MED ORDER — METRONIDAZOLE 500 MG PO TABS: 500.0000 mg | ORAL_TABLET | Freq: Two times a day (BID) | ORAL | 0 refills | Status: DC

## 2018-04-01 MED ORDER — METRONIDAZOLE 0.75 % VA GEL: 1.0000 | VAGINAL | 3 refills | Status: DC

## 2018-04-01 NOTE — Patient Instructions (Signed)
You will need a vaginal exam if no improvement in next 2weeks.  Start metronidazole gel after completion of metronidazole tablets.  Bacterial Vaginosis Bacterial vaginosis is a vaginal infection that occurs when the normal balance of bacteria in the vagina is disrupted. It results from an overgrowth of certain bacteria. This is the most common vaginal infection among women ages 38-44. Because bacterial vaginosis increases your risk for STIs (sexually transmitted infections), getting treated can help reduce your risk for chlamydia, gonorrhea, herpes, and HIV (human immunodeficiency virus). Treatment is also important for preventing complications in pregnant women, because this condition can cause an early (premature) delivery. What are the causes? This condition is caused by an increase in harmful bacteria that are normally present in small amounts in the vagina. However, the reason that the condition develops is not fully understood. What increases the risk? The following factors may make you more likely to develop this condition:  Having a new sexual partner or multiple sexual partners.  Having unprotected sex.  Douching.  Having an intrauterine device (IUD).  Smoking.  Drug and alcohol abuse.  Taking certain antibiotic medicines.  Being pregnant.  You cannot get bacterial vaginosis from toilet seats, bedding, swimming pools, or contact with objects around you. What are the signs or symptoms? Symptoms of this condition include:  Grey or white vaginal discharge. The discharge can also be watery or foamy.  A fish-like odor with discharge, especially after sexual intercourse or during menstruation.  Itching in and around the vagina.  Burning or pain with urination.  Some women with bacterial vaginosis have no signs or symptoms. How is this diagnosed? This condition is diagnosed based on:  Your medical history.  A physical exam of the vagina.  Testing a sample of vaginal  fluid under a microscope to look for a large amount of bad bacteria or abnormal cells. Your health care provider may use a cotton swab or a small wooden spatula to collect the sample.  How is this treated? This condition is treated with antibiotics. These may be given as a pill, a vaginal cream, or a medicine that is put into the vagina (suppository). If the condition comes back after treatment, a second round of antibiotics may be needed. Follow these instructions at home: Medicines  Take over-the-counter and prescription medicines only as told by your health care provider.  Take or use your antibiotic as told by your health care provider. Do not stop taking or using the antibiotic even if you start to feel better. General instructions  If you have a female sexual partner, tell her that you have a vaginal infection. She should see her health care provider and be treated if she has symptoms. If you have a female sexual partner, he does not need treatment.  During treatment: ? Avoid sexual activity until you finish treatment. ? Do not douche. ? Avoid alcohol as directed by your health care provider. ? Avoid breastfeeding as directed by your health care provider.  Drink enough water and fluids to keep your urine clear or pale yellow.  Keep the area around your vagina and rectum clean. ? Wash the area daily with warm water. ? Wipe yourself from front to back after using the toilet.  Keep all follow-up visits as told by your health care provider. This is important. How is this prevented?  Do not douche.  Wash the outside of your vagina with warm water only.  Use protection when having sex. This includes latex condoms and dental  dams.  Limit how many sexual partners you have. To help prevent bacterial vaginosis, it is best to have sex with just one partner (monogamous).  Make sure you and your sexual partner are tested for STIs.  Wear cotton or cotton-lined underwear.  Avoid wearing  tight pants and pantyhose, especially during summer.  Limit the amount of alcohol that you drink.  Do not use any products that contain nicotine or tobacco, such as cigarettes and e-cigarettes. If you need help quitting, ask your health care provider.  Do not use illegal drugs. Where to find more information:  Centers for Disease Control and Prevention: AppraiserFraud.fi  American Sexual Health Association (ASHA): www.ashastd.org  [place] Department of Health and Financial controller, Office on Women's Health: DustingSprays.pl or SecuritiesCard.it Contact a health care provider if:  Your symptoms do not improve, even after treatment.  You have more discharge or pain when urinating.  You have a fever.  You have pain in your abdomen.  You have pain during sex.  You have vaginal bleeding between periods. Summary  Bacterial vaginosis is a vaginal infection that occurs when the normal balance of bacteria in the vagina is disrupted.  Because bacterial vaginosis increases your risk for STIs (sexually transmitted infections), getting treated can help reduce your risk for chlamydia, gonorrhea, herpes, and HIV (human immunodeficiency virus). Treatment is also important for preventing complications in pregnant women, because the condition can cause an early (premature) delivery.  This condition is treated with antibiotic medicines. These may be given as a pill, a vaginal cream, or a medicine that is put into the vagina (suppository). This information is not intended to replace advice given to you by your health care provider. Make sure you discuss any questions you have with your health care provider. Document Released: 11/05/2005 Document Revised: 03/11/2017 Document Reviewed: 07/21/2016 Elsevier Interactive Patient Education  [employer].

## 2018-04-01 NOTE — Progress Notes (Signed)
Subjective:  Patient ID: Brenda Kim, female    DOB: [birth year]  Age: 41 y.o. MRN: [id number]  CC: Vaginal Discharge (discharge,odor,itchy/going on for 18 years--tired OTC)  Vaginal Discharge  The patient's primary symptoms include genital itching and vaginal discharge. The patient's pertinent negatives include no genital lesions, genital odor, genital rash, missed menses, pelvic pain or vaginal bleeding. This is a recurrent problem. The current episode started more than 1 year ago. The problem occurs intermittently. The problem has been waxing and waning. The patient is experiencing no pain. She is not pregnant. Pertinent negatives include no abdominal pain, anorexia, back pain, chills, constipation, diarrhea, discolored urine, dysuria, fever, flank pain, frequency, joint swelling, nausea, painful intercourse, rash or urgency. The vaginal discharge was thick and white. There has been no bleeding. The symptoms are aggravated by intercourse. She has tried antifungals for the symptoms. The treatment provided no relief.    Outpatient Medications Prior to Visit  Medication Sig Dispense Refill  . baclofen (LIORESAL) 10 MG tablet TAKE 1 TABLET BY MOUTH TWICE DAILY AS NEEDED FOR MUSCLE SPASMS 30 tablet 2  . dicyclomine (BENTYL) 10 MG capsule Take by mouth.    . hydrochlorothiazide (HYDRODIURIL) 25 MG tablet Take 1 tablet by mouth daily.  0  . levothyroxine (SYNTHROID, LEVOTHROID) 100 MCG tablet Take 1 tablet (100 mcg total) by mouth daily. 90 tablet 1  . linaclotide (LINZESS) 72 MCG capsule Take by mouth.    Marland Kitchen LORazepam (ATIVAN) 0.5 MG tablet Take 1 tablet (0.5 mg total) by mouth daily as needed for anxiety. 30 tablet 0  . losartan (COZAAR) 100 MG tablet TAKE 1 TABLET BY MOUTH ONCE DAILY 90 tablet 1  . metFORMIN (GLUCOPHAGE-XR) 500 MG 24 hr tablet Take 1 tablet (500 mg total) by mouth at bedtime. 100 tablet 1  . NON FORMULARY Cinnamon OTC    . PARoxetine (PAXIL-CR) 12.5 MG 24 hr tablet Take one each  morning for one week and then increase to two each day. 60 tablet 1  . Cholecalciferol (VITAMIN D PO) Take by mouth.     No facility-administered medications prior to visit.     ROS See HPI  Objective:  BP 110/74   Pulse 68   Temp 98.4 F (36.9 C) (Oral)   Ht 5\' 5"  (1.651 m)   Wt 220 lb (99.8 kg)   SpO2 97%   BMI 36.61 kg/m   BP Readings from Last 3 Encounters:  04/01/18 110/74  03/11/18 120/90  01/22/18 110/90    Wt Readings from Last 3 Encounters:  04/01/18 220 lb (99.8 kg)  01/22/18 230 lb 12.8 oz (104.7 kg)  12/17/17 235 lb (106.6 kg)    Physical Exam  Constitutional: No distress.  Cardiovascular: Normal rate.  Pulmonary/Chest: Effort normal.  Genitourinary:  Genitourinary Comments: Declined pelvic exam  Psychiatric: She has a normal mood and affect. Her behavior is normal.  Vitals reviewed.  Lab Results  Component Value Date   WBC 4.2 12/06/2017   HGB 12.5 12/06/2017   HCT 38.3 12/06/2017   PLT 261.0 12/06/2017   GLUCOSE 116 (H) 12/06/2017   CHOL 183 12/06/2017   TRIG 90.0 12/06/2017   HDL 49.20 12/06/2017   LDLCALC 116 (H) 12/06/2017   ALT 11 12/06/2017   AST 15 12/06/2017   NA 142 12/06/2017   K 4.1 12/06/2017   CL 102 12/06/2017   CREATININE 0.90 12/06/2017   BUN 15 12/06/2017   CO2 34 (H) 12/06/2017   TSH 1.18 02/24/2018  HGBA1C 6.2 12/06/2017    Assessment & Plan:   Brenda Kim was seen today for vaginal discharge.  Diagnoses and all orders for this visit:  Chronic vaginitis -     metroNIDAZOLE (METROGEL VAGINAL) 0.75 % vaginal gel; Place 1 Applicatorful vaginally 2 (two) times a week. Monday and Friday -     metroNIDAZOLE (FLAGYL) 500 MG tablet; Take 1 tablet (500 mg total) by mouth 2 (two) times daily.   I am having Brenda Kim start on metroNIDAZOLE and metroNIDAZOLE. I am also having her maintain her hydrochlorothiazide, NON FORMULARY, Cholecalciferol (VITAMIN D PO), losartan, PARoxetine, LORazepam, metFORMIN, baclofen,  levothyroxine, linaclotide, and dicyclomine.  Meds ordered this encounter  Medications  . metroNIDAZOLE (METROGEL VAGINAL) 0.75 % vaginal gel    Sig: Place 1 Applicatorful vaginally 2 (two) times a week. Monday and Friday    Dispense:  70 g    Refill:  3    Order Specific Question:   Supervising Provider    Answer:   Lucille Passy [[id number]]  . metroNIDAZOLE (FLAGYL) 500 MG tablet    Sig: Take 1 tablet (500 mg total) by mouth 2 (two) times daily.    Dispense:  10 tablet    Refill:  0    Order Specific Question:   Supervising Provider    Answer:   Lucille Passy [[id number]]    Follow-up: No follow-ups on file.  Wilfred Lacy, NP

## 2018-04-09 ENCOUNTER — Ambulatory Visit: Payer: BLUE CROSS/BLUE SHIELD | Admitting: Psychology

## 2018-04-23 ENCOUNTER — Ambulatory Visit: Payer: Self-pay | Admitting: Psychology

## 2018-04-30 ENCOUNTER — Ambulatory Visit: Payer: Self-pay | Admitting: Psychology

## 2018-05-07 ENCOUNTER — Ambulatory Visit: Payer: Self-pay | Admitting: Psychology

## 2018-06-26 IMAGING — DX DG KNEE COMPLETE 4+V*L*
4 series · 4 of 4 positions shown · non-contrast
Comparison: None.

CLINICAL DATA: Left knee contusion, initial encounter. Left knee
injury 2 weeks ago. Trip and fall while skating.

EXAM:
LEFT KNEE - COMPLETE 4+ VIEW

[knee ap]
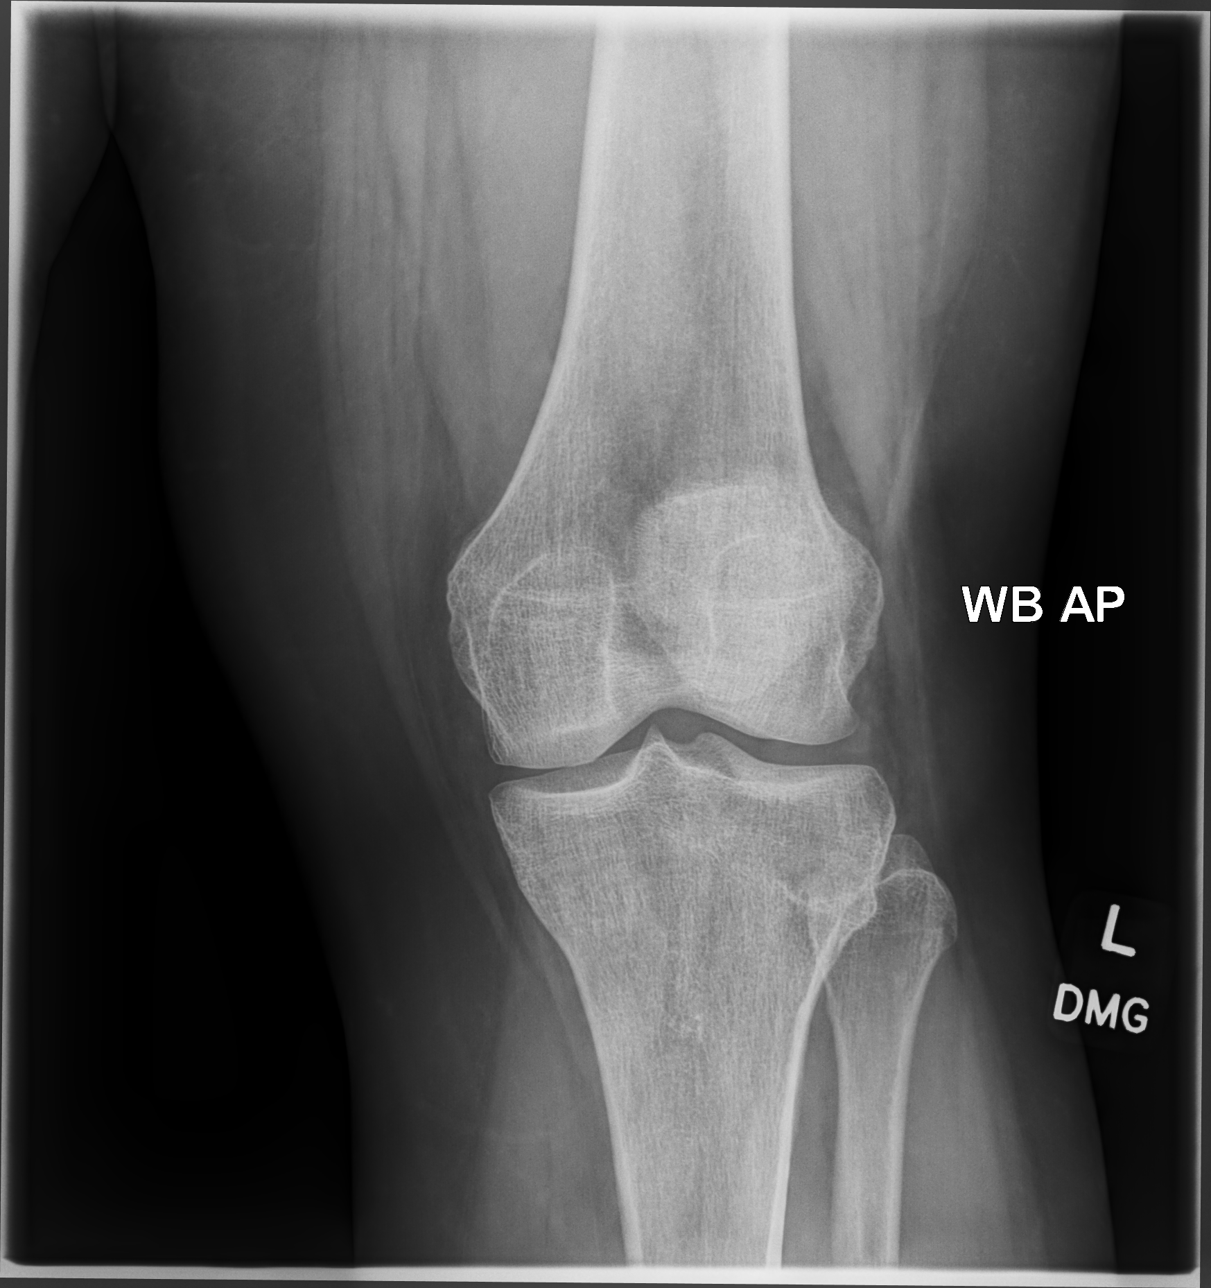

[knee [person_name] view pa]
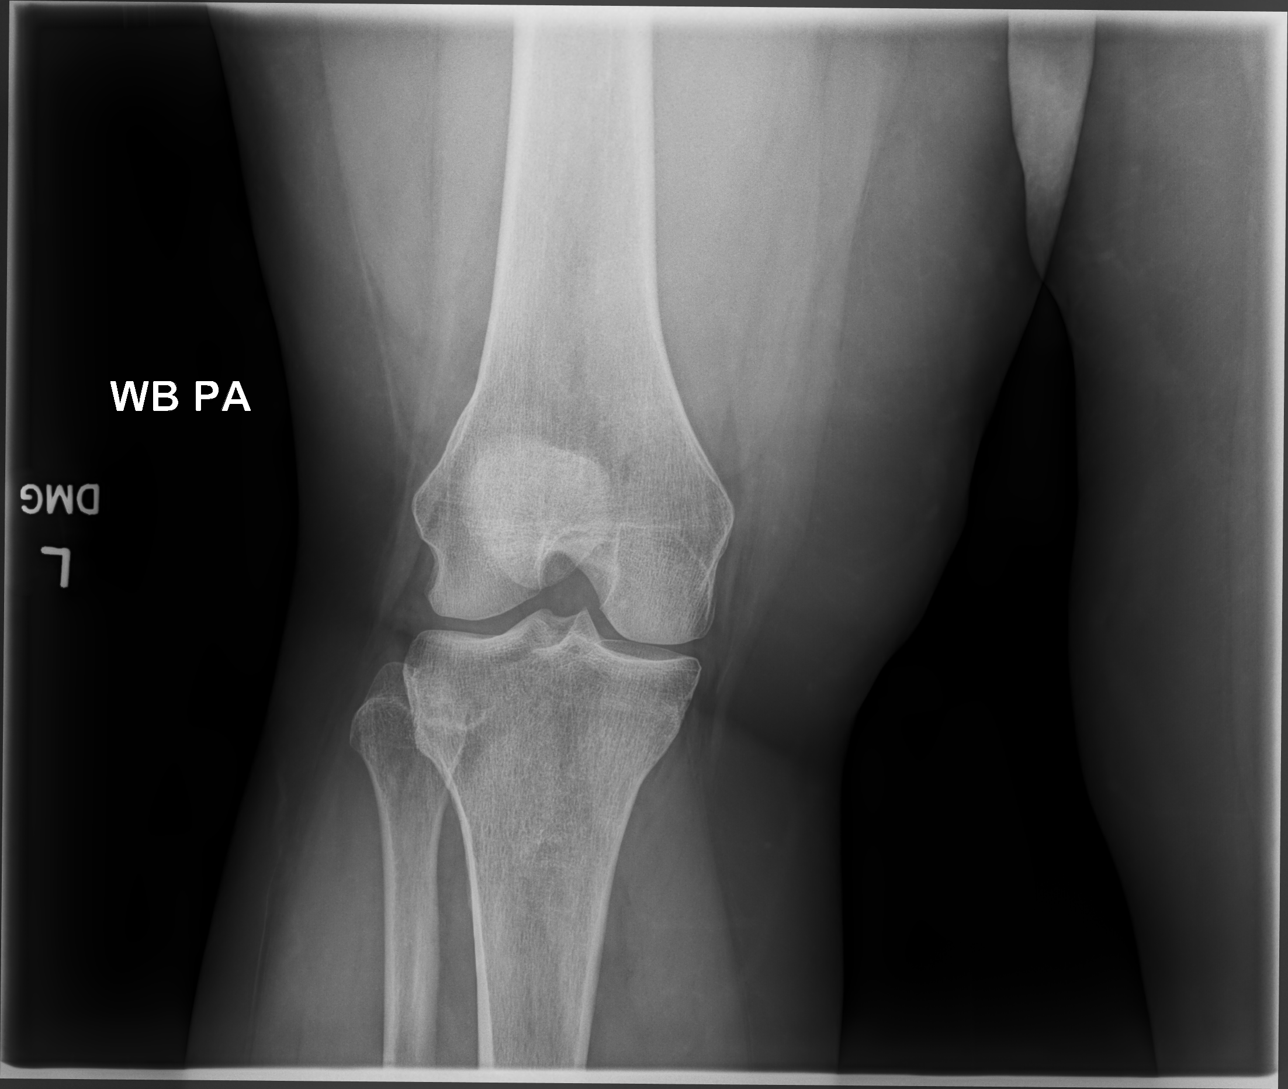

[knee lat]
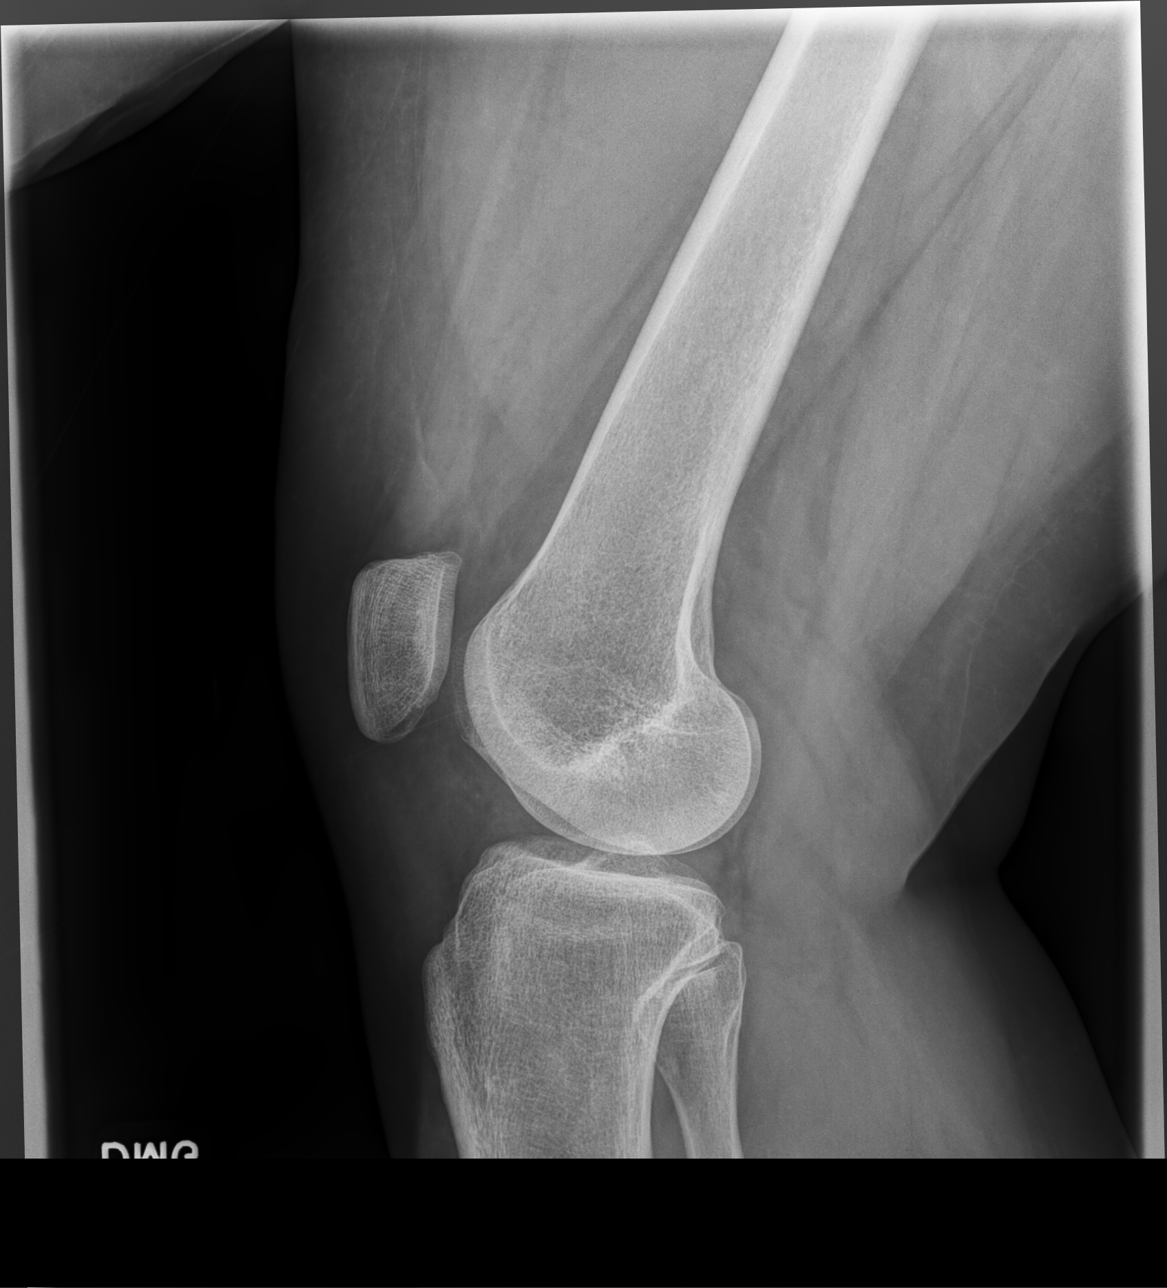

[patella (sunrise)]
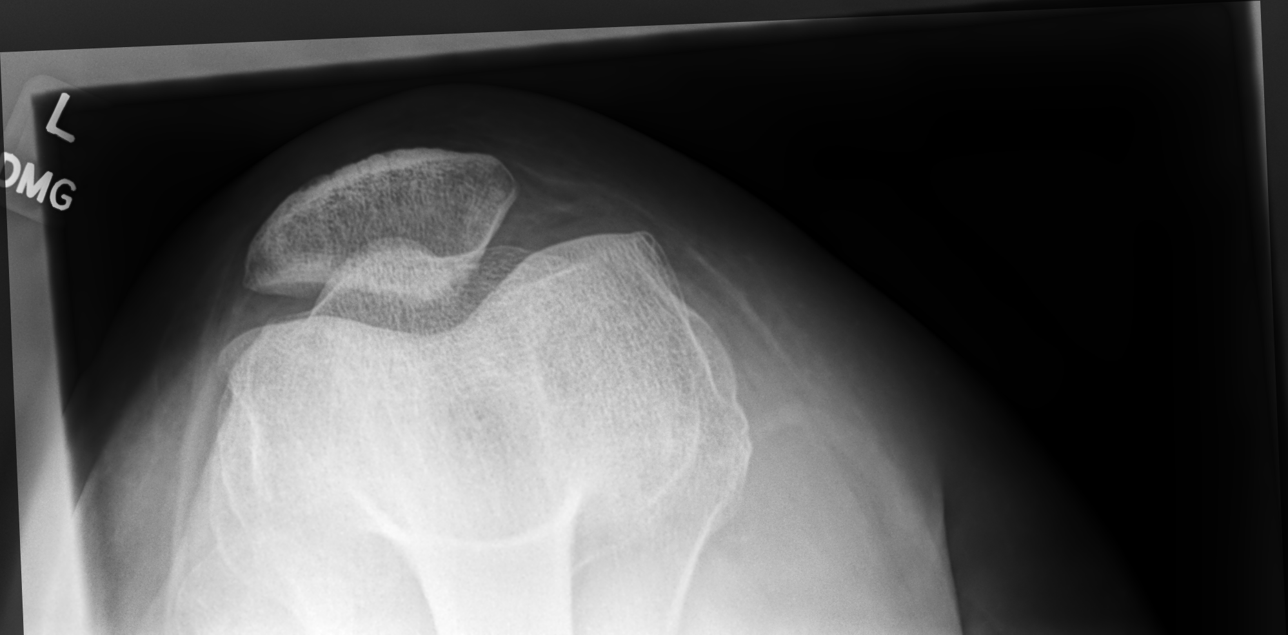

[4 of 4 positions shown; findings below may reference images not displayed]

FINDINGS: No evidence of fracture, dislocation, or joint effusion. No evidence
of arthropathy or other focal bone abnormality. Soft tissues are
unremarkable.
IMPRESSION: Negative radiographs of the left knee.

## 2018-06-27 ENCOUNTER — Other Ambulatory Visit: Payer: Self-pay | Admitting: Nurse Practitioner

## 2018-06-27 DIAGNOSIS — N761 Subacute and chronic vaginitis: Secondary | ICD-10-CM

## 2018-08-06 ENCOUNTER — Other Ambulatory Visit: Payer: Self-pay | Admitting: Family Medicine

## 2018-08-06 DIAGNOSIS — F419 Anxiety disorder, unspecified: Principal | ICD-10-CM

## 2018-08-06 DIAGNOSIS — F329 Major depressive disorder, single episode, unspecified: Secondary | ICD-10-CM

## 2018-08-16 ENCOUNTER — Other Ambulatory Visit: Payer: Self-pay | Admitting: Family Medicine

## 2018-09-05 ENCOUNTER — Other Ambulatory Visit: Payer: Self-pay | Admitting: Family Medicine

## 2018-09-22 ENCOUNTER — Other Ambulatory Visit: Payer: Self-pay | Admitting: Family Medicine

## 2018-09-22 NOTE — Telephone Encounter (Signed)
Copied from [place] [phone] [phone]. Topic: Quick Communication - Rx Refill/Question >> Sep 22, 2018  2:13 PM Wynetta Emery, [place] C wrote: Medication: losartan (COZAAR) 100 MG tablet  Has the patient contacted their pharmacy? Yes - pt says that pharmacy stated that they have sent several request. Pt says that she is now completely out of her medication.   (Agent: If no, request that the patient contact the pharmacy for the refill.) (Agent: If yes, when and what did the pharmacy advise?)  Preferred Pharmacy (with phone number or street name): [facility] [place], [address] [phone] (Phone) [phone] (Fax)    Agent: Please be advised that RX refills may take up to 3 business days. We ask that you follow-up with your pharmacy.

## 2018-09-23 MED ORDER — LOSARTAN POTASSIUM 100 MG PO TABS: 100.0000 mg | ORAL_TABLET | Freq: Every day | ORAL | 1 refills | Status: AC

## 2018-09-23 NOTE — Telephone Encounter (Signed)
Requested medication (s) are due for refill today: yes  Requested medication (s) are on the active medication list: yes  Last refill:  ?  Future visit scheduled: no  Notes to clinic:  Last labs 12/06/17; last office visit 02/01/18    Requested Prescriptions  Pending Prescriptions Disp Refills   losartan (COZAAR) 100 MG tablet 90 tablet 1    Sig: Take 1 tablet (100 mg total) by mouth daily.     Cardiovascular:  Angiotensin Receptor Blockers Failed - 09/22/2018  2:21 PM      Failed - Cr in normal range and within 180 days    Creatinine, Ser  Date Value Ref Range Status  12/06/2017 0.90 0.40 - 1.20 mg/dL Final         Failed - K in normal range and within 180 days    Potassium  Date Value Ref Range Status  12/06/2017 4.1 3.5 - 5.1 mEq/L Final         Failed - Valid encounter within last 6 months    Recent Outpatient Visits          5 months ago Chronic vaginitis   [facility] [place], [place] [place], NP   6 months ago Irritable bowel syndrome with constipation   [facility], [place], DO   8 months ago Contusion of left knee, initial encounter   [facility] Ethelene Hal, Mortimer Fries, MD   9 months ago Dysuria   [facility] [place], Charlene Brooke, NP   9 months ago Cervical radiculopathy   [facility] [place] Rosemarie Ax, MD             Passed - Patient is not pregnant      Passed - Last BP in normal range    BP Readings from Last 1 Encounters:  04/01/18 110/74

## 2018-11-28 ENCOUNTER — Encounter: Payer: Self-pay | Admitting: Family Medicine

## 2018-11-28 ENCOUNTER — Ambulatory Visit: Payer: Self-pay | Admitting: Family Medicine

## 2018-11-28 ENCOUNTER — Ambulatory Visit: Payer: BLUE CROSS/BLUE SHIELD | Admitting: Family Medicine

## 2018-11-28 VITALS — BP 128/80 | HR 70 | Temp 98.5°F | Ht 65.0 in | Wt 224.2 lb

## 2018-11-28 DIAGNOSIS — L720 Epidermal cyst: Secondary | ICD-10-CM | POA: Diagnosis not present

## 2018-11-28 DIAGNOSIS — D171 Benign lipomatous neoplasm of skin and subcutaneous tissue of trunk: Secondary | ICD-10-CM

## 2018-11-28 DIAGNOSIS — J309 Allergic rhinitis, unspecified: Secondary | ICD-10-CM | POA: Diagnosis not present

## 2018-11-28 MED ORDER — FLUTICASONE PROPIONATE 50 MCG/ACT NA SUSP: 2.0000 | Freq: Every day | NASAL | 6 refills | Status: AC

## 2018-11-28 MED ORDER — DOXYCYCLINE HYCLATE 100 MG PO TABS: 100.0000 mg | ORAL_TABLET | Freq: Two times a day (BID) | ORAL | 0 refills | Status: AC

## 2018-11-28 NOTE — Patient Instructions (Signed)
Allergic Rhinitis, Adult Allergic rhinitis is a reaction to allergens in the air. Allergens are tiny specks (particles) in the air that cause your body to have an allergic reaction. This condition cannot be passed from person to person (is not contagious). Allergic rhinitis cannot be cured, but it can be controlled. There are two types of allergic rhinitis:  Seasonal. This type is also called hay fever. It happens only during certain times of the year.  Perennial. This type can happen at any time of the year. What are the causes? This condition may be caused by:  Pollen from grasses, trees, and weeds.  House dust mites.  Pet dander.  Mold. What are the signs or symptoms? Symptoms of this condition include:  Sneezing.  Runny or stuffy nose (nasal congestion).  A lot of mucus in the back of the throat (postnasal drip).  Itchy nose.  Tearing of the eyes.  Trouble sleeping.  Being sleepy during day. How is this treated? There is no cure for this condition. You should avoid things that trigger your symptoms (allergens). Treatment can help to relieve symptoms. This may include:  Medicines that block allergy symptoms, such as antihistamines. These may be given as a shot, nasal spray, or pill.  Shots that are given until your body becomes less sensitive to the allergen (desensitization).  Stronger medicines, if all other treatments have not worked. Follow these instructions at home: Avoiding allergens   Find out what you are allergic to. Common allergens include smoke, dust, and pollen.  Avoid them if you can. These are some of the things that you can do to avoid allergens: ? Replace carpet with wood, tile, or vinyl flooring. Carpet can trap dander and dust. ? Clean any mold found in the home. ? Do not smoke. Do not allow smoking in your home. ? Change your heating and air conditioning filter at least once a month. ? During allergy season:  Keep windows closed as much as  you can. If possible, use air conditioning when there is a lot of pollen in the air.  Use a special filter for allergies with your furnace and air conditioner.  Plan outdoor activities when pollen counts are lowest. This is usually during the early morning or evening hours.  If you do go outdoors when pollen count is high, wear a special mask for people with allergies.  When you come indoors, take a shower and change your clothes before sitting on furniture or bedding. General instructions  Do not use fans in your home.  Do not hang clothes outside to dry.  Wear sunglasses to keep pollen out of your eyes.  Wash your hands right away after you touch household pets.  Take over-the-counter and prescription medicines only as told by your doctor.  Keep all follow-up visits as told by your doctor. This is important. Contact a doctor if:  You have a fever.  You have a cough that does not go away (is persistent).  You start to make whistling sounds when you breathe (wheeze).  Your symptoms do not get better with treatment.  You have thick fluid coming from your nose.  You start to have nosebleeds. Get help right away if:  Your tongue or your lips are swollen.  You have trouble breathing.  You feel dizzy or you feel like you are going to pass out (faint).  You have cold sweats. Summary  Allergic rhinitis is a reaction to allergens in the air.  This condition may be   caused by allergens. These include pollen, dust mites, pet dander, and mold.  Symptoms include a runny, itchy nose, sneezing, or tearing eyes. You may also have trouble sleeping or feel sleepy during the day.  Treatment includes taking medicines and avoiding allergens. You may also get shots or take stronger medicines.  Get help if you have a fever or a cough that does not stop. Get help right away if you are short of breath. This information is not intended to replace advice given to you by your health care  provider. Make sure you discuss any questions you have with your health care provider. Document Released: 03/07/2011 Document Revised: 05/27/2018 Document Reviewed: 05/27/2018 Elsevier Interactive Patient Education  2019 [employer]  Lipoma  A lipoma is a noncancerous (benign) tumor that is made up of fat cells. This is a very common type of soft-tissue growth. Lipomas are usually found under the skin (subcutaneous). They may occur in any tissue of the body that contains fat. Common areas for lipomas to appear include the back, shoulders, buttocks, and thighs.  Lipomas grow slowly, and they are usually painless. Most lipomas do not cause problems and do not require treatment. What are the causes? The cause of this condition is not known. What increases the risk? You are more likely to develop this condition if:  You are 75-60 years old.  You have a family history of lipomas. What are the signs or symptoms? A lipoma usually appears as a small, round bump under the skin. In most cases, the lump will:  Feel soft or rubbery.  Not cause pain or other symptoms. However, if a lipoma is located in an area where it pushes on nerves, it can become painful or cause other symptoms. How is this diagnosed? A lipoma can usually be diagnosed with a physical exam. You may also have tests to confirm the diagnosis and to rule out other conditions. Tests may include:  Imaging tests, such as a CT scan or MRI.  Removal of a tissue sample to be looked at under a microscope (biopsy). How is this treated? Treatment for this condition depends on the size of the lipoma and whether it is causing any symptoms.  For small lipomas that are not causing problems, no treatment is needed.  If a lipoma is bigger or it causes problems, surgery may be done to remove the lipoma. Lipomas can also be removed to improve appearance. Most often, the procedure is done after applying a medicine that numbs the area (local  anesthetic). Follow these instructions at home:  Watch your lipoma for any changes.  Keep all follow-up visits as told by your health care provider. This is important. Contact a health care provider if:  Your lipoma becomes larger or hard.  Your lipoma becomes painful, red, or increasingly swollen. These could be signs of infection or a more serious condition. Get help right away if:  You develop tingling or numbness in an area near the lipoma. This could indicate that the lipoma is causing nerve damage. Summary  A lipoma is a noncancerous tumor that is made up of fat cells.  Most lipomas do not cause problems and do not require treatment.  If a lipoma is bigger or it causes problems, surgery may be done to remove the lipoma. This information is not intended to replace advice given to you by your health care provider. Make sure you discuss any questions you have with your health care provider. Document Released: 10/26/2002 Document Revised:  10/22/2017 Document Reviewed: 10/22/2017 Elsevier Interactive Patient Education  2019 [employer].  Epidermal Cyst  An epidermal cyst is a small, painless lump under your skin. The cyst contains a grayish-white, bad-smelling substance (keratin). Do not try to pop or open an epidermal cyst yourself. What are the causes?  A blocked hair follicle.  A hair that curls and re-enters the skin instead of growing straight out of the skin.  A blocked pore.  Irritated skin.  An injury to the skin.  Certain conditions that are passed along from parent to child (inherited).  Human papillomavirus (HPV).  Long-term sun damage to the skin. What increases the risk?  Having acne.  Being overweight.  Being 66-61 years old. What are the signs or symptoms? These cysts are usually harmless, but they can get infected. Symptoms of infection may include:  Redness.  Inflammation.  Tenderness.  Warmth.  Fever.  A grayish-white, bad-smelling  substance drains from the cyst.  Pus drains from the cyst. How is this treated? In many cases, epidermal cysts go away on their own without treatment. If a cyst becomes infected, treatment may include:  Opening and draining the cyst, done by a doctor. After draining, you may need minor surgery to remove the rest of the cyst.  Antibiotic medicine.  Shots of medicines (steroids) that help to reduce inflammation.  Surgery to remove the cyst. Surgery may be done if the cyst: ? Becomes large. ? Bothers you. ? Has a chance of turning into cancer.  Do not try to open a cyst yourself. Follow these instructions at home:  Take over-the-counter and prescription medicines only as told by your doctor.  If you were prescribed an antibiotic medicine, take it it as told by your doctor. Do not stop using the antibiotic even if you start to feel better.  Keep the area around your cyst clean and dry.  Wear loose, dry clothing.  Avoid touching your cyst.  Check your cyst every day for signs of infection. Check for: ? Redness, swelling, or pain. ? Fluid or blood. ? Warmth. ? Pus or a bad smell.  Keep all follow-up visits as told by your doctor. This is important. How is this prevented?  Wear clean, dry, clothing.  Avoid wearing tight clothing.  Keep your skin clean and dry. Take showers or baths every day. Contact a doctor if:  Your cyst has symptoms of infection.  Your condition does not improve or gets worse.  You have a cyst that looks different from other cysts you have had.  You have a fever. Get help right away if:  Redness spreads from the cyst into the area close by. Summary  An epidermal cyst is a sac made of skin tissue.  If a cyst becomes infected, treatment may include surgery to open and drain the cyst, or to remove it.  Take over-the-counter and prescription medicines only as told by your doctor.  Contact a doctor if your condition is not improving or is  getting worse.  Keep all follow-up visits as told by your doctor. This is important. This information is not intended to replace advice given to you by your health care provider. Make sure you discuss any questions you have with your health care provider. Document Released: 12/13/2004 Document Revised: 05/19/2018 Document Reviewed: 09/07/2015 Elsevier Interactive Patient Education  2019 [employer].

## 2018-11-28 NOTE — Progress Notes (Signed)
Established Patient Office Visit  Subjective:  Patient ID: Brenda Kim, female    DOB: 1976-02-28  Age: 43 y.o. MRN: [id number]  CC:  Chief Complaint  Patient presents with  . lump on side of throat  . knot on left side of stomach  . bump under eyelid    HPI Brenda Kim presents for treatment and evaluation of a 2 to 40-month history of intermittent postnasal drip sore throat hoarseness in her voice cough with occasional sneeze.  She denies fevers chills, nausea vomiting, facial pressure teeth pain, headache, wheezing or history of reactive airway disease.  She quit smoking 7 months ago.  She is concerned also about a mass in her right cheek.  It seems to be mostly under the skin.  It is not been inflamed and has not drained spontaneously.  She is also noted a mass in her abdominal wall on the left.  Past Medical History:  Diagnosis Date  . Allergy   . Arthritis   . Depression   . Hypertension   . Thyroid disease     Past Surgical History:  Procedure Laterality Date  . ABDOMINAL HYSTERECTOMY    . APPENDECTOMY    . TONSILLECTOMY      Family History  Problem Relation Age of Onset  . Arthritis Mother   . Hyperlipidemia Mother   . Arthritis Father   . Diabetes Father   . Hyperlipidemia Father   . Mental illness Brother   . Diabetes Son   . Hypertension Son     Social History   Socioeconomic History  . Marital status: Single    Spouse name: Not on file  . Number of children: Not on file  . Years of education: Not on file  . Highest education level: Not on file  Occupational History  . Not on file  Social Needs  . Financial resource strain: Not on file  . Food insecurity:    Worry: Not on file    Inability: Not on file  . Transportation needs:    Medical: Not on file    Non-medical: Not on file  Tobacco Use  . Smoking status: Former Research scientist (life sciences)  . Smokeless tobacco: Never Used  . Tobacco comment: Quit in April of 2019.  Substance and Sexual Activity  .  Alcohol use: Yes  . Drug use: Not on file  . Sexual activity: Not on file  Lifestyle  . Physical activity:    Days per week: Not on file    Minutes per session: Not on file  . Stress: Not on file  Relationships  . Social connections:    Talks on phone: Not on file    Gets together: Not on file    Attends religious service: Not on file    Active member of club or organization: Not on file    Attends meetings of clubs or organizations: Not on file    Relationship status: Not on file  . Intimate partner violence:    Fear of current or ex partner: Not on file    Emotionally abused: Not on file    Physically abused: Not on file    Forced sexual activity: Not on file  Other Topics Concern  . Not on file  Social History Narrative  . Not on file    Outpatient Medications Prior to Visit  Medication Sig Dispense Refill  . amLODipine (NORVASC) 10 MG tablet TAKE 1 TABLET(10 MG) BY MOUTH DAILY 90 tablet 0  . baclofen (LIORESAL)  10 MG tablet TAKE 1 TABLET BY MOUTH TWICE DAILY AS NEEDED FOR MUSCLE SPASMS 30 tablet 2  . Cholecalciferol (VITAMIN D PO) Take by mouth.    . dicyclomine (BENTYL) 10 MG capsule Take by mouth.    . hydrochlorothiazide (HYDRODIURIL) 25 MG tablet TAKE 1 TABLET BY MOUTH EVERY MORNING 90 tablet 0  . levothyroxine (SYNTHROID, LEVOTHROID) 100 MCG tablet Take 1 tablet (100 mcg total) by mouth daily. 90 tablet 1  . linaclotide (LINZESS) 72 MCG capsule Take by mouth.    Marland Kitchen LORazepam (ATIVAN) 0.5 MG tablet TAKE 1 TABLET BY MOUTH EVERY DAY AS NEEDED 30 tablet 0  . losartan (COZAAR) 100 MG tablet Take 1 tablet (100 mg total) by mouth daily. 90 tablet 1  . metFORMIN (GLUCOPHAGE-XR) 500 MG 24 hr tablet Take 1 tablet (500 mg total) by mouth at bedtime. 100 tablet 1  . NON FORMULARY Cinnamon OTC    . PARoxetine (PAXIL-CR) 12.5 MG 24 hr tablet Take one each morning for one week and then increase to two each day. 60 tablet 1  . metroNIDAZOLE (FLAGYL) 500 MG tablet Take 1 tablet (500  mg total) by mouth 2 (two) times daily. 10 tablet 0  . metroNIDAZOLE (METROGEL VAGINAL) 0.75 % vaginal gel Place 1 Applicatorful vaginally 2 (two) times a week. Monday and Friday 70 g 3   No facility-administered medications prior to visit.     Allergies  Allergen Reactions  . Ofloxacin Itching    Hives, swelling, redness Hives, swelling, redness  Hives, swelling, redness  . Flucloxacillin Hives  . Other Rash    ROS Review of Systems  Constitutional: Negative for diaphoresis, fatigue, fever and unexpected weight change.  HENT: Positive for congestion, postnasal drip, sneezing, sore throat and voice change. Negative for rhinorrhea, sinus pressure, sinus pain and trouble swallowing.   Eyes: Negative for photophobia and visual disturbance.  Respiratory: Positive for cough. Negative for shortness of breath and wheezing.   Cardiovascular: Negative.   Gastrointestinal: Negative.   Endocrine: Negative for polyphagia and polyuria.  Musculoskeletal: Negative.   Skin: Negative for color change, pallor, rash and wound.  Neurological: Negative for speech difficulty, light-headedness, numbness and headaches.  Hematological: Does not bruise/bleed easily.  Psychiatric/Behavioral: Negative.       Objective:    Physical Exam  Constitutional: She is oriented to person, place, and time. She appears well-developed and well-nourished. No distress.  HENT:  Head: Normocephalic and atraumatic.  Right Ear: External ear normal.  Left Ear: External ear normal.  Mouth/Throat: Oropharynx is clear and moist. No oropharyngeal exudate.  Eyes: Pupils are equal, round, and reactive to light. Conjunctivae are normal. Right eye exhibits no discharge. Left eye exhibits no discharge. No scleral icterus.  Neck: Neck supple. No JVD present. No tracheal deviation present. No thyromegaly present.  Cardiovascular: Normal rate, regular rhythm and normal heart sounds.  Pulmonary/Chest: Effort normal and breath  sounds normal. No stridor.  Abdominal: Soft. Bowel sounds are normal. She exhibits no distension. There is no abdominal tenderness. There is no rebound and no guarding.  Lymphadenopathy:    She has no cervical adenopathy.  Neurological: She is alert and oriented to person, place, and time.  Skin: Skin is warm and dry. She is not diaphoretic.     Psychiatric: She has a normal mood and affect. Her behavior is normal.    BP 128/80   Pulse 70   Temp 98.5 F (36.9 C) (Oral)   Ht 5\' 5"  (1.651 m)  Wt 224 lb 4 oz (101.7 kg)   SpO2 95%   BMI 37.32 kg/m  Wt Readings from Last 3 Encounters:  11/28/18 224 lb 4 oz (101.7 kg)  04/01/18 220 lb (99.8 kg)  01/22/18 230 lb 12.8 oz (104.7 kg)   BP Readings from Last 3 Encounters:  11/28/18 128/80  04/01/18 110/74  03/11/18 120/90   Guideline developer:  UpToDate (see UpToDate for funding source) Date Released: June 2014  Health Maintenance Due  Topic Date Due  . PNEUMOCOCCAL POLYSACCHARIDE VACCINE AGE 72-64 HIGH RISK  04/05/1977  . FOOT EXAM  04/05/1985  . OPHTHALMOLOGY EXAM  04/05/1985  . TETANUS/TDAP  04/05/1994  . HEMOGLOBIN A1C  06/05/2018  . INFLUENZA VACCINE  06/19/2018    There are no preventive care reminders to display for this patient.  Lab Results  Component Value Date   TSH 1.18 02/24/2018   Lab Results  Component Value Date   WBC 4.2 12/06/2017   HGB 12.5 12/06/2017   HCT 38.3 12/06/2017   MCV 84.7 12/06/2017   PLT 261.0 12/06/2017   Lab Results  Component Value Date   NA 142 12/06/2017   K 4.1 12/06/2017   CO2 34 (H) 12/06/2017   GLUCOSE 116 (H) 12/06/2017   BUN 15 12/06/2017   CREATININE 0.90 12/06/2017   BILITOT 0.5 12/06/2017   ALKPHOS 58 12/06/2017   AST 15 12/06/2017   ALT 11 12/06/2017   PROT 6.9 12/06/2017   ALBUMIN 4.2 12/06/2017   CALCIUM 9.7 12/06/2017   GFR 88.02 12/06/2017   Lab Results  Component Value Date   CHOL 183 12/06/2017   Lab Results  Component Value Date   HDL 49.20  12/06/2017   Lab Results  Component Value Date   LDLCALC 116 (H) 12/06/2017   Lab Results  Component Value Date   TRIG 90.0 12/06/2017   Lab Results  Component Value Date   CHOLHDL 4 12/06/2017   Lab Results  Component Value Date   HGBA1C 6.2 12/06/2017      Assessment & Plan:   Problem List Items Addressed This Visit      Respiratory   Allergic rhinitis   Relevant Medications   fluticasone (FLONASE) 50 MCG/ACT nasal spray     Other   Lipoma of abdominal wall - Primary   Inclusion cyst   Relevant Medications   doxycycline (VIBRA-TABS) 100 MG tablet      Meds ordered this encounter  Medications  . doxycycline (VIBRA-TABS) 100 MG tablet    Sig: Take 1 tablet (100 mg total) by mouth 2 (two) times daily.    Dispense:  20 tablet    Refill:  0  . fluticasone (FLONASE) 50 MCG/ACT nasal spray    Sig: Place 2 sprays into both nostrils daily.    Dispense:  16 g    Refill:  6    Follow-up: Return if symptoms worsen or fail to improve.   Patient was given information about lipomas.  She will let us know if it changes at all.  She will try Doxy for 10 days with warm compresses.  She will follow-up if the cyst in her cheek changes.  We will try Flonase or her rhinitis and postnasal drip.  She will follow-up if it does not clear within the next month.  She was also given information on inclusion cyst and an allergy

## 2018-12-27 ENCOUNTER — Other Ambulatory Visit: Payer: Self-pay | Admitting: Family Medicine

## 2018-12-27 DIAGNOSIS — R7989 Other specified abnormal findings of blood chemistry: Secondary | ICD-10-CM

## 2018-12-27 DIAGNOSIS — F329 Major depressive disorder, single episode, unspecified: Secondary | ICD-10-CM

## 2018-12-27 DIAGNOSIS — E039 Hypothyroidism, unspecified: Secondary | ICD-10-CM

## 2018-12-27 DIAGNOSIS — F419 Anxiety disorder, unspecified: Secondary | ICD-10-CM

## 2019-01-07 ENCOUNTER — Telehealth: Payer: Self-pay | Admitting: Family Medicine

## 2019-01-07 NOTE — Telephone Encounter (Signed)
Dr. Bryan Lemma. Dr Ethelene Hal please advise is this OK

## 2019-01-07 NOTE — Telephone Encounter (Signed)
Patient wants to switch from Dr. Ethelene Hal to Dr. Bryan Lemma

## 2019-01-08 NOTE — Telephone Encounter (Signed)
ok 

## 2019-01-08 NOTE — Telephone Encounter (Signed)
Admin can you please help with this

## 2019-01-08 NOTE — Telephone Encounter (Signed)
Done

## 2019-01-08 NOTE — Telephone Encounter (Signed)
Okay with me 

## 2019-01-09 ENCOUNTER — Other Ambulatory Visit: Payer: Self-pay | Admitting: Family Medicine

## 2019-01-16 ENCOUNTER — Encounter: Payer: BLUE CROSS/BLUE SHIELD | Admitting: Family Medicine

## 2019-02-12 ENCOUNTER — Other Ambulatory Visit: Payer: Self-pay | Admitting: Family Medicine

## 2019-03-13 ENCOUNTER — Other Ambulatory Visit: Payer: Self-pay | Admitting: Family Medicine

## 2019-03-13 DIAGNOSIS — E119 Type 2 diabetes mellitus without complications: Secondary | ICD-10-CM

## 2019-03-13 NOTE — Telephone Encounter (Signed)
Pt needs to make apt w/labs.

## 2019-03-21 ENCOUNTER — Other Ambulatory Visit: Payer: Self-pay | Admitting: Family Medicine

## 2019-03-21 DIAGNOSIS — F419 Anxiety disorder, unspecified: Principal | ICD-10-CM

## 2019-03-21 DIAGNOSIS — F329 Major depressive disorder, single episode, unspecified: Secondary | ICD-10-CM

## 2019-03-23 ENCOUNTER — Other Ambulatory Visit: Payer: Self-pay

## 2019-03-23 DIAGNOSIS — F419 Anxiety disorder, unspecified: Principal | ICD-10-CM

## 2019-03-23 DIAGNOSIS — F329 Major depressive disorder, single episode, unspecified: Secondary | ICD-10-CM

## 2019-03-23 DIAGNOSIS — F32A Depression, unspecified: Secondary | ICD-10-CM

## 2020-01-28 ENCOUNTER — Other Ambulatory Visit: Payer: Self-pay | Admitting: Family Medicine

## 2020-01-28 DIAGNOSIS — E119 Type 2 diabetes mellitus without complications: Secondary | ICD-10-CM

## 2020-01-29 NOTE — Telephone Encounter (Signed)
Pt must schedule appointment and labs first per last fill from [facility]/thx dmf
